# Patient Record
Sex: Male | Born: 1955 | Race: White | Hispanic: No | Marital: Married | Smoking: Former smoker
Health system: Southern US, Community
[De-identification: ages and names within clinical notes are randomized; demographics above are authoritative.]

## PROBLEM LIST (undated history)

## (undated) DIAGNOSIS — I519 Heart disease, unspecified: Secondary | ICD-10-CM

## (undated) DIAGNOSIS — Z87442 Personal history of urinary calculi: Secondary | ICD-10-CM

## (undated) DIAGNOSIS — M5126 Other intervertebral disc displacement, lumbar region: Secondary | ICD-10-CM

## (undated) DIAGNOSIS — I10 Essential (primary) hypertension: Secondary | ICD-10-CM

## (undated) DIAGNOSIS — Z86010 Personal history of colonic polyps: Secondary | ICD-10-CM

## (undated) DIAGNOSIS — Z87891 Personal history of nicotine dependence: Secondary | ICD-10-CM

## (undated) DIAGNOSIS — I447 Left bundle-branch block, unspecified: Secondary | ICD-10-CM

## (undated) HISTORY — DX: Other intervertebral disc displacement, lumbar region: M51.26

## (undated) HISTORY — DX: Essential (primary) hypertension: I10

## (undated) HISTORY — DX: Personal history of colonic polyps: Z86.010

## (undated) HISTORY — PX: COLONOSCOPY: SHX174

## (undated) HISTORY — DX: Personal history of urinary calculi: Z87.442

---

## 2004-04-17 DIAGNOSIS — Z87442 Personal history of urinary calculi: Secondary | ICD-10-CM

## 2004-04-17 HISTORY — DX: Personal history of urinary calculi: Z87.442

## 2004-10-11 ENCOUNTER — Encounter: Admission: RE | Admit: 2004-10-11 | Discharge: 2004-10-11 | Payer: Self-pay | Admitting: Family Medicine

## 2010-04-17 DIAGNOSIS — Z860100 Personal history of colon polyps, unspecified: Secondary | ICD-10-CM

## 2010-04-17 HISTORY — DX: Personal history of colonic polyps: Z86.010

## 2010-04-17 HISTORY — DX: Personal history of colon polyps, unspecified: Z86.0100

## 2010-08-16 LAB — HM COLONOSCOPY

## 2013-08-26 ENCOUNTER — Ambulatory Visit (INDEPENDENT_AMBULATORY_CARE_PROVIDER_SITE_OTHER): Payer: BC Managed Care – PPO | Admitting: Family Medicine

## 2013-08-26 ENCOUNTER — Encounter: Payer: Self-pay | Admitting: Family Medicine

## 2013-08-26 VITALS — BP 144/94 | HR 82 | Ht 70.0 in | Wt 201.0 lb

## 2013-08-26 DIAGNOSIS — IMO0002 Reserved for concepts with insufficient information to code with codable children: Secondary | ICD-10-CM

## 2013-08-26 DIAGNOSIS — M5410 Radiculopathy, site unspecified: Secondary | ICD-10-CM | POA: Insufficient documentation

## 2013-08-26 MED ORDER — PREDNISONE 50 MG PO TABS
50.0000 mg | ORAL_TABLET | Freq: Every day | ORAL | Status: DC
Start: 2013-08-26 — End: 2013-08-26

## 2013-08-26 MED ORDER — TRAMADOL HCL 50 MG PO TABS: 50.0000 mg | ORAL_TABLET | Freq: Three times a day (TID) | ORAL | Status: DC | PRN

## 2013-08-26 MED ORDER — MELOXICAM 15 MG PO TABS: 15.0000 mg | ORAL_TABLET | Freq: Every day | ORAL | Status: DC

## 2013-08-26 MED ORDER — MELOXICAM 15 MG PO TABS
15.0000 mg | ORAL_TABLET | Freq: Every day | ORAL | Status: DC
Start: 2013-08-26 — End: 2013-08-26

## 2013-08-26 MED ORDER — PREDNISONE 50 MG PO TABS: 50.0000 mg | ORAL_TABLET | Freq: Every day | ORAL | Status: DC

## 2013-08-26 NOTE — Patient Instructions (Signed)
Nice to meet you Exercises 3 times a week.  Prednisone 50mg  daily for 5 days Meloxicam daily for 10 days after prednisone Tramadol for breakthrough pain.  Exercises starting in 48 hours Ice 20 minutes 2 times a day Consider Vitamin D 2000 IU daily.  Come back in 10-14 days to make sure you are getting better.

## 2013-08-26 NOTE — Progress Notes (Signed)
  Tawana ScaleZach Smith D.O. [facility] [address] [place], [place] [address] Phone: [phone] Subjective:      CC: Back pain  OZH:YQMVHQIONGHPI:Subjective Jacob MarrowScott L Walters is a 58 y.o. male coming in with complaint of patient states that he has his pain for approximately 1 week. Patient states that it is more of a dull aching throbbing sensation is worse with standing position for a long amount of time or standing for a long amount of time. Patient has had a history of a renal stone before but states that this is significantly different. Patient denies any fevers or chills denies any pain with urination or denies any abnormal weight loss. Patient states that the severity of pain is 8/10. Patient has tried over-the-counter as well as his wife's narcotics with minimal benefit. Patient states it is affecting him with sleep as well as work as well. Patient does state that the pain does radiate down the posterior aspect of his leg all the way down to his calf. Patient denies any weakness.     Past medical history, social, surgical and family history all reviewed in electronic medical record.   Review of Systems: No headache, visual changes, nausea, vomiting, diarrhea, constipation, dizziness, abdominal pain, skin rash, fevers, chills, night sweats, weight loss, swollen lymph nodes, body aches, joint swelling, muscle aches, chest pain, shortness of breath, mood changes.   Objective Blood pressure 144/94, pulse 82, height 5\' 10"  (1.778 m), weight 201 lb (91.173 kg), SpO2 97.00%.  General: No apparent distress alert and oriented x3 mood and affect normal, dressed appropriately.  HEENT: Pupils equal, extraocular movements intact  Respiratory: Patient's speak in full sentences and does not appear short of breath  Cardiovascular: No lower extremity edema, non tender, no erythema  Skin: Warm dry intact with no signs of infection or rash on extremities or on axial skeleton.  Abdomen: Soft nontender  Neuro: Cranial  nerves II through XII are intact, neurovascularly intact in all extremities with 2+ DTRs and 2+ pulses.  Lymph: No lymphadenopathy of posterior or anterior cervical chain or axillae bilaterally.  Gait normal with good balance and coordination.  MSK:  Non tender with full range of motion and good stability and symmetric strength and tone of shoulders, elbows, wrist, hip, knee and ankles bilaterally.  Back Exam:  Inspection: Unremarkable poor core strength Motion: Flexion 35 deg, Extension 45 deg, Side Bending to 35 deg bilaterally,  Rotation to 35 deg bilaterally  SLR laying: Positive left XSLR laying: Negative  Palpable tenderness: Tender to palpation in the left paraspinal musculature on the left side FABER: negative. Sensory change: Gross sensation intact to all lumbar and sacral dermatomes.  Reflexes: 2+ at both patellar tendons, 2+ at achilles tendons, Babinski's downgoing.  Strength at foot  Plantar-flexion: 5/5 Dorsi-flexion: 5/5 Eversion: 5/5 Inversion: 5/5  Leg strength  Quad: 4/5 Hamstring: 4/5 Hip flexor: 4/5 Hip abductors: 4/5       Impression and Recommendations:     This case required medical decision making of moderate complexity.

## 2013-08-26 NOTE — Assessment & Plan Note (Addendum)
Patient is having back pain for the last week that does correspond with an S1 nerve root impingement. Patient given home exercise program, prednisone as well as anti-inflammatories and pain medications. Patient will start home exercise program in 48 hours and we discussed an icing protocol. Patient will try these interventions and come back again in 10-14 days for further evaluation. Patient continues to have trouble we'll consider further imaging such as x-ray.

## 2013-08-27 ENCOUNTER — Telehealth: Payer: Self-pay | Admitting: *Deleted

## 2013-08-27 NOTE — Telephone Encounter (Signed)
Discussed with pt

## 2013-08-27 NOTE — Telephone Encounter (Signed)
Yes ok to double up tramadol and take tylenol with tramadol.

## 2013-08-27 NOTE — Telephone Encounter (Signed)
Pt was seen yesterday, he called this morning stating that the Tramadol is not helping with his pain. Pt wants to know if he should double up on the Tramadol or if there is something else he should take.

## 2013-09-03 ENCOUNTER — Telehealth: Payer: Self-pay | Admitting: Family Medicine

## 2013-09-03 NOTE — Telephone Encounter (Signed)
Please call and ask Did the prednisone help?  If so then we can do 5 more days If it did not help he needs to be seen again so we can get xrays and likely MRI.  Thank you

## 2013-09-03 NOTE — Telephone Encounter (Signed)
Discussed with pt. He states the prednisone did not help. Pt is coming for appt tomorrow.

## 2013-09-03 NOTE — Telephone Encounter (Signed)
Pt's spouse call stated that the pain pill that Mr. Jacob Walters is on is not working. Please call pt back, he cant even move/work.

## 2013-09-04 ENCOUNTER — Other Ambulatory Visit: Payer: Self-pay | Admitting: *Deleted

## 2013-09-04 ENCOUNTER — Ambulatory Visit (INDEPENDENT_AMBULATORY_CARE_PROVIDER_SITE_OTHER): Payer: BC Managed Care – PPO | Admitting: Family Medicine

## 2013-09-04 ENCOUNTER — Ambulatory Visit (INDEPENDENT_AMBULATORY_CARE_PROVIDER_SITE_OTHER)
Admission: RE | Admit: 2013-09-04 | Discharge: 2013-09-04 | Disposition: A | Discharge status: Home / Self Care | Payer: BC Managed Care – PPO | Source: Ambulatory Visit | Attending: Family Medicine | Admitting: Family Medicine

## 2013-09-04 ENCOUNTER — Encounter: Payer: Self-pay | Admitting: Family Medicine

## 2013-09-04 VITALS — BP 140/86 | HR 104 | Wt 195.0 lb

## 2013-09-04 DIAGNOSIS — IMO0002 Reserved for concepts with insufficient information to code with codable children: Secondary | ICD-10-CM

## 2013-09-04 DIAGNOSIS — M5410 Radiculopathy, site unspecified: Secondary | ICD-10-CM

## 2013-09-04 MED ORDER — DIAZEPAM 5 MG PO TABS: 5.0000 mg | ORAL_TABLET | Freq: Two times a day (BID) | ORAL | Status: DC | PRN

## 2013-09-04 MED ORDER — GABAPENTIN 300 MG PO CAPS: 300.0000 mg | ORAL_CAPSULE | Freq: Every day | ORAL | Status: DC

## 2013-09-04 NOTE — Patient Instructions (Addendum)
Sent in new medicine at night Valium take 30 minutes before MRI if we do this I will call you with the results of the xray soon.  Continue other meds for now

## 2013-09-04 NOTE — Assessment & Plan Note (Signed)
Patient continues to have severe back pain with radicular symptoms. Patient does have some mild weakness down the left leg as well compared to his previous visit. I discussed with patient though without further imaging is necessary. We'll get x-rays at this time and the patient continues to not respond to conservative therapy which is the addition of gabapentin at night we need to consider doing an MRI. I think there is a likelihood that patient does have an S1 nerve root compression. We'll discuss after imaging for further evaluation and treatment.  Spent greater than 25 minutes with patient face-to-face and had greater than 50% of counseling including as described above in assessment and plan.

## 2013-09-04 NOTE — Progress Notes (Signed)
  Tawana ScaleZach Verley Pariseau D.O. [facility] [address] [place], [place] [address] Phone: [phone] Subjective:      CC: Back pain follow up  YQM:VHQIONGEXBHPI:Subjective Jacob Walters is a 58 y.o. male coming in with complaint of back pain. Patient was seen previously and there was a concern for patient having radiculopathy. Patient was tried to be treated conservatively with prednisone, anti-inflammatories, and home exercise program. Patient has not made any significant improvement whatsoever. Patient states he did not make any improvement with the prednisone either. Patient states that the pain is unrelenting and continuing to keep him up at night. Patient has been doubling up on the tramadol to try to get some relief. Patient states that the pain is so severe he continues to wake him up at night. Patient denies any weakness but states at the radiation now goes all the way to his calf and near his ankle the     Past medical history, social, surgical and family history all reviewed in electronic medical record.   Review of Systems: No headache, visual changes, nausea, vomiting, diarrhea, constipation, dizziness, abdominal pain, skin rash, fevers, chills, night sweats, weight loss, swollen lymph nodes, body aches, joint swelling, muscle aches, chest pain, shortness of breath, mood changes.   Objective Blood pressure 140/86, pulse 104, weight 195 lb (88.451 kg), SpO2 97.00%.  General: No apparent distress alert and oriented x3 mood and affect normal, dressed appropriately.  HEENT: Pupils equal, extraocular movements intact  Respiratory: Patient's speak in full sentences and does not appear short of breath  Cardiovascular: No lower extremity edema, non tender, no erythema  Skin: Warm dry intact with no signs of infection or rash on extremities or on axial skeleton.  Abdomen: Soft nontender  Neuro: Cranial nerves II through XII are intact, neurovascularly intact in all extremities with 2+ DTRs and 2+  pulses.  Lymph: No lymphadenopathy of posterior or anterior cervical chain or axillae bilaterally.  Gait normal with good balance and coordination.  MSK:  Non tender with full range of motion and good stability and symmetric strength and tone of shoulders, elbows, wrist, hip, knee and ankles bilaterally.  Back Exam:  Inspection: Unremarkable poor core strength Motion: Flexion 35 deg, Extension 45 deg, Side Bending to 35 deg bilaterally,  Rotation to 35 deg bilaterally  SLR laying: Positive left XSLR laying: Positive with radicular symptoms down the left leg Palpable tenderness: Tender to palpation in the left paraspinal musculature on the left side FABER: negative. Sensory change: Gross sensation intact to all lumbar and sacral dermatomes.  Reflexes: 2+ at both patellar tendons, 2+ at achilles tendons, Babinski's downgoing.  Strength at foot  Plantar-flexion: 5/5 Dorsi-flexion: 5/5 Eversion: 5/5 Inversion: 5/5  Leg strength  Quad: 4/5 Hamstring: 4/5 Hip flexor: 4/5 Hip abductors: 4/5       Impression and Recommendations:     This case required medical decision making of moderate complexity.

## 2013-09-05 ENCOUNTER — Telehealth: Payer: Self-pay | Admitting: Family Medicine

## 2013-09-05 NOTE — Telephone Encounter (Signed)
Pt called stated that he is trying to get refill for tramadol and they told him he can no get it refill until 09/09/13. Pt stated he told Dr. Katrinka Blazing that he has to double up the dosage because it didn't work too good for him. Please call pt, pt is out of this med.

## 2013-09-05 NOTE — Telephone Encounter (Signed)
Spoke with pharmacy & it's been taken care of. Pt made aware.

## 2013-09-05 NOTE — Telephone Encounter (Signed)
refilled 

## 2013-09-11 ENCOUNTER — Telehealth: Payer: Self-pay | Admitting: Family Medicine

## 2013-09-11 MED ORDER — HYDROCODONE-ACETAMINOPHEN 7.5-325 MG PO TABS: 1.0000 | ORAL_TABLET | Freq: Three times a day (TID) | ORAL | Status: DC | PRN

## 2013-09-11 NOTE — Telephone Encounter (Signed)
Discussed with patient over the phone. Patient is taking tramadol with Tylenol 4 times a day as well the meloxicam. Patient is taking gabapentin one time before bed and one time in the middle of the night at 300 mg at each dose. Patient is still having significant amount of pain that is coming in to did not want to move at all. Patient states that the pain seems to be worsening and not getting better. I do feel that possibly some narcotics for short period to be beneficial. Patient will come over and pick up medication. Patient scheduled for the MRI on Sunday.

## 2013-09-11 NOTE — Telephone Encounter (Signed)
Patient's wife called in regards to getting a stronger pain medication prescribed for the patient's back pain. She says the Tramadol is not working. She wants [place] to call the patient to advise what else Dingledine be recommended. Please advise.

## 2013-09-14 ENCOUNTER — Ambulatory Visit
Admission: RE | Admit: 2013-09-14 | Discharge: 2013-09-14 | Disposition: A | Discharge status: Home / Self Care | Payer: BC Managed Care – PPO | Source: Ambulatory Visit | Attending: Family Medicine | Admitting: Family Medicine

## 2013-09-14 DIAGNOSIS — M5410 Radiculopathy, site unspecified: Secondary | ICD-10-CM

## 2013-09-16 ENCOUNTER — Encounter: Payer: Self-pay | Admitting: Family Medicine

## 2013-09-16 ENCOUNTER — Ambulatory Visit (INDEPENDENT_AMBULATORY_CARE_PROVIDER_SITE_OTHER): Payer: BC Managed Care – PPO | Admitting: Family Medicine

## 2013-09-16 VITALS — BP 144/94 | HR 109 | Ht 70.0 in | Wt 190.0 lb

## 2013-09-16 DIAGNOSIS — M5410 Radiculopathy, site unspecified: Secondary | ICD-10-CM

## 2013-09-16 DIAGNOSIS — IMO0002 Reserved for concepts with insufficient information to code with codable children: Secondary | ICD-10-CM

## 2013-09-16 MED ORDER — HYDROCODONE-ACETAMINOPHEN 7.5-325 MG PO TABS: 1.0000 | ORAL_TABLET | Freq: Three times a day (TID) | ORAL | Status: DC | PRN

## 2013-09-16 NOTE — Patient Instructions (Signed)
It is good to see you Jacob Walters refill the norco one more time.  Note for work .  Epidural steroid injections  Come back in 2 weeks after the injection and we Jacob Walters check in.

## 2013-09-16 NOTE — Progress Notes (Signed)
  [facility] [address] [place], [place] [address] Phone: [phone] Subjective:      CC: Back pain follow up  YIF:OYDXAJOINO Jacob Walters is a 58 y.o. male coming in with complaint of back pain. Patient continues to have back pain. Patient is nonnarcotics just for pain relief. Patient was sent for an MRI that does show an S1 nerve root impingement on the left side. Patient states that the pain has been significant and has not been able to work on a regular basis. Patient states that the pain is unrelenting and would like something further done.  MRI was reviewed today and findings were described above.    Past medical history, social, surgical and family history all reviewed in electronic medical record.   Review of Systems: No headache, visual changes, nausea, vomiting, diarrhea, constipation, dizziness, abdominal pain, skin rash, fevers, chills, night sweats, weight loss, swollen lymph nodes, body aches, joint swelling, muscle aches, chest pain, shortness of breath, mood changes.   Objective Blood pressure 144/94, pulse 109, height 5\' 10"  (1.778 m), weight 190 lb (86.183 kg), SpO2 97.00%.  General: No apparent distress alert and oriented x3 mood and affect normal, dressed appropriately.  HEENT: Pupils equal, extraocular movements intact  Respiratory: Patient's speak in full sentences and does not appear short of breath  Cardiovascular: No lower extremity edema, non tender, no erythema  Skin: Warm dry intact with no signs of infection or rash on extremities or on axial skeleton.  Abdomen: Soft nontender  Neuro: Cranial nerves II through XII are intact, neurovascularly intact in all extremities with 2+ DTRs and 2+ pulses.  Lymph: No lymphadenopathy of posterior or anterior cervical chain or axillae bilaterally.  Gait normal with good balance and coordination.  MSK:  Non tender with full range of motion and good stability and symmetric strength and tone  of shoulders, elbows, wrist, hip, knee and ankles bilaterally.  Back Exam:  Inspection: Unremarkable poor core strength Motion: Flexion 35 deg, Extension 45 deg, Side Bending to 35 deg bilaterally,  Rotation to 35 deg bilaterally  SLR laying: Positive left XSLR laying: Positive with radicular symptoms down the left leg Palpable tenderness: Tender to palpation in the left paraspinal musculature on the left side, no change from previous exam. FABER: negative. Sensory change: Gross sensation intact to all lumbar and sacral dermatomes.  Reflexes: 2+ at both patellar tendons, 2+ at achilles tendons, Babinski's downgoing.  Strength at foot  Plantar-flexion: 5/5 Dorsi-flexion: 5/5 Eversion: 5/5 Inversion: 5/5  Leg strength  Quad: 4/5 Hamstring: 4/5 Hip flexor: 4/5 Hip abductors: 4/5       Impression and Recommendations:     This case required medical decision making of moderate complexity.

## 2013-09-16 NOTE — Assessment & Plan Note (Signed)
We discussed multiple different treatment options at this time. Patient has elected to try an epidural steroid injection. Patient will be made a referral and make any urgent status. We'll have patient have an L5-S1 epidural. Patient and will come back 2 weeks after the epidural and we'll discuss this patient had therapeutic improvement. Epidural steroid injection we'll also be diagnostic. Patient continues that problem he Milazzo need surgical intervention.  Spent greater than 25 minutes with patient face-to-face and had greater than 50% of counseling including as described above in assessment and plan.

## 2013-09-17 ENCOUNTER — Other Ambulatory Visit: Payer: Self-pay | Admitting: *Deleted

## 2013-09-17 DIAGNOSIS — M5410 Radiculopathy, site unspecified: Secondary | ICD-10-CM

## 2013-09-18 ENCOUNTER — Ambulatory Visit
Admission: RE | Admit: 2013-09-18 | Discharge: 2013-09-18 | Disposition: A | Discharge status: Home / Self Care | Payer: BC Managed Care – PPO | Source: Ambulatory Visit | Attending: Family Medicine | Admitting: Family Medicine

## 2013-09-18 VITALS — BP 199/105 | HR 114

## 2013-09-18 DIAGNOSIS — M5410 Radiculopathy, site unspecified: Secondary | ICD-10-CM

## 2013-09-18 MED ORDER — IOHEXOL 180 MG/ML  SOLN
1.0000 mL | Freq: Once | INTRAMUSCULAR | Status: AC | PRN
  Administered 2013-09-18: 1 mL via EPIDURAL

## 2013-09-18 MED ORDER — METHYLPREDNISOLONE ACETATE 40 MG/ML INJ SUSP (RADIOLOG
120.0000 mg | Freq: Once | INTRAMUSCULAR | Status: AC
  Administered 2013-09-18: 120 mg via EPIDURAL

## 2013-09-18 NOTE — Discharge Instructions (Signed)
Post Procedure Spinal Discharge Instruction Sheet ° °1. You Rance resume a regular diet and any medications that you routinely take (including pain medications). ° °2. No driving day of procedure. ° °3. Light activity throughout the rest of the day.  Do not do any strenuous work, exercise, bending or lifting.  The day following the procedure, you can resume normal physical activity but you should refrain from exercising or physical therapy for at least three days thereafter. ° ° °Common Side Effects: ° °· Headaches- take your usual medications as directed by your physician.  Increase your fluid intake.  Caffeinated beverages Gaiser be helpful.  Lie flat in bed until your headache resolves. ° °· Restlessness or inability to sleep- you Rudy have trouble sleeping for the next few days.  Ask your referring physician if you need any medication for sleep. ° °· Facial flushing or redness- should subside within a few days. ° °· Increased pain- a temporary increase in pain a day or two following your procedure is not unusual.  Take your pain medication as prescribed by your referring physician. ° °· Leg cramps ° °Please contact our office at [phone] for the following symptoms: °· Fever greater than 100 degrees. °· Headaches unresolved with medication after 2-3 days. °· Increased swelling, pain, or redness at injection site. ° °Thank you for visiting our office. °

## 2013-09-29 ENCOUNTER — Ambulatory Visit (INDEPENDENT_AMBULATORY_CARE_PROVIDER_SITE_OTHER): Payer: BC Managed Care – PPO | Admitting: Family Medicine

## 2013-09-29 ENCOUNTER — Encounter: Payer: Self-pay | Admitting: Family Medicine

## 2013-09-29 VITALS — BP 138/84 | HR 122 | Ht 70.0 in | Wt 185.0 lb

## 2013-09-29 DIAGNOSIS — M5410 Radiculopathy, site unspecified: Secondary | ICD-10-CM

## 2013-09-29 DIAGNOSIS — IMO0002 Reserved for concepts with insufficient information to code with codable children: Secondary | ICD-10-CM

## 2013-09-29 MED ORDER — HYDROCODONE-ACETAMINOPHEN 7.5-325 MG PO TABS: 1.0000 | ORAL_TABLET | Freq: Three times a day (TID) | ORAL | Status: DC | PRN

## 2013-09-29 NOTE — Progress Notes (Signed)
  Tawana ScaleZach Smith D.O. [facility] [address] [place], [place] [address] Phone: [phone](336) [phone] Subjective:      CC: Back pain follow up  ZHY:QMVHQIONGEHPI:Subjective Jacob Walters is a 58 y.o. male coming in with complaint of back pain.  Patient was sent for an MRI that does show an S1 nerve root impingement on the left side. Patient was sent for an epidural which patient did have. Patient states that he did have some resolution of pain. Patient states that this pain was resolved for approximately hours after the injection. Patient states that unfortunately came back. Patient states now it is just as bad as the was prior to the epidural steroid injection one week ago. Denies any new symptoms.  Patient has failed conservative therapy including oral anti-inflammatories, corticosteroids, home as well as formal physical therapy, as well as epidural steroid injection. Patient does have symptoms that correlate well with S1 nerve root impingement on the left side an MRI corresponding well.    Past medical history, social, surgical and family history all reviewed in electronic medical record.   Review of Systems: No headache, visual changes, nausea, vomiting, diarrhea, constipation, dizziness, abdominal pain, skin rash, fevers, chills, night sweats, weight loss, swollen lymph nodes, body aches, joint swelling, muscle aches, chest pain, shortness of breath, mood changes.   Objective Blood pressure 138/84, pulse 122, height 5\' 10"  (1.778 m), weight 185 lb (83.915 kg), SpO2 98.00%.  General: No apparent distress alert and oriented x3 mood and affect normal, dressed appropriately.  HEENT: Pupils equal, extraocular movements intact  Respiratory: Patient's speak in full sentences and does not appear short of breath  Cardiovascular: No lower extremity edema, non tender, no erythema  Skin: Warm dry intact with no signs of infection or rash on extremities or on axial skeleton.  Abdomen: Soft nontender  Neuro:  Cranial nerves II through XII are intact, neurovascularly intact in all extremities with 2+ DTRs and 2+ pulses.  Lymph: No lymphadenopathy of posterior or anterior cervical chain or axillae bilaterally.  Gait normal with good balance and coordination.  MSK:  Non tender with full range of motion and good stability and symmetric strength and tone of shoulders, elbows, wrist, hip, knee and ankles bilaterally.  Back Exam:  Inspection: Unremarkable poor core strength Motion: Flexion 35 deg, Extension 45 deg, Side Bending to 35 deg bilaterally,  Rotation to 35 deg bilaterally  SLR laying: Positive left XSLR laying: Positive with radicular symptoms down the left leg Palpable tenderness: Tender to palpation in the left paraspinal musculature on the left side, no change from previous exam. FABER: negative. Sensory change: Gross sensation intact to all lumbar and sacral dermatomes.  Reflexes: 2+ at both patellar tendons, 2+ at achilles tendons, Babinski's downgoing.  Strength at foot  Plantar-flexion: 5/5 Dorsi-flexion: 5/5 Eversion: 5/5 Inversion: 5/5  Leg strength  Quad: 4/5 Hamstring: 4/5 Hip flexor: 4/5 Hip abductors: 4/5   No significant change from previous exam.   Impression and Recommendations:     This case required medical decision making of moderate complexity.

## 2013-09-29 NOTE — Assessment & Plan Note (Signed)
Once again patient has chronic intractable pain that is not responding to  conservative measures. Patient did have some response to the L5-S1 epidural injection which helps us better diagnose patient's nerve root impingement. 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 Patient is unable to work secondary to this pain. We like him to have an evaluation by orthopedic surgery. There is likelihood patient will need a microdiscectomy with him failing all conservative measures. Referral has been made Dr. Yevette Edwardsumonski. Patient can discuss any other questions with me if he has any during this process.  Spent greater than 25 minutes with patient face-to-face and had greater than 50% of counseling including as described above in assessment and plan.

## 2013-09-29 NOTE — Patient Instructions (Signed)
Good to see you Dr[facility] Estill BambergMark Dumonski [facility] [address] [facility] [phone] Tell them xrays and MRI Friday 845am

## 2013-10-14 ENCOUNTER — Other Ambulatory Visit: Payer: Self-pay | Admitting: Orthopedic Surgery

## 2013-10-14 ENCOUNTER — Encounter (HOSPITAL_COMMUNITY): Payer: Self-pay | Admitting: Pharmacy Technician

## 2013-10-15 DIAGNOSIS — M5126 Other intervertebral disc displacement, lumbar region: Secondary | ICD-10-CM

## 2013-10-15 HISTORY — DX: Other intervertebral disc displacement, lumbar region: M51.26

## 2013-10-20 ENCOUNTER — Encounter (HOSPITAL_COMMUNITY)
Admission: RE | Admit: 2013-10-20 | Discharge: 2013-10-20 | Disposition: A | Discharge status: Home / Self Care | Payer: BC Managed Care – PPO | Source: Ambulatory Visit | Attending: Orthopedic Surgery | Admitting: Orthopedic Surgery

## 2013-10-20 ENCOUNTER — Ambulatory Visit (HOSPITAL_COMMUNITY)
Admission: RE | Admit: 2013-10-20 | Discharge: 2013-10-20 | Disposition: A | Discharge status: Home / Self Care | Payer: BC Managed Care – PPO | Source: Ambulatory Visit | Attending: Orthopedic Surgery | Admitting: Orthopedic Surgery

## 2013-10-20 ENCOUNTER — Encounter (HOSPITAL_COMMUNITY): Payer: Self-pay

## 2013-10-20 ENCOUNTER — Emergency Department (HOSPITAL_COMMUNITY): Payer: BC Managed Care – PPO

## 2013-10-20 ENCOUNTER — Observation Stay (HOSPITAL_COMMUNITY)
Admission: EM | Admit: 2013-10-20 | Discharge: 2013-10-21 | Disposition: A | Discharge status: Home / Self Care | Payer: BC Managed Care – PPO | Attending: Cardiovascular Disease | Admitting: Cardiovascular Disease

## 2013-10-20 ENCOUNTER — Encounter (HOSPITAL_COMMUNITY): Payer: Self-pay | Admitting: Emergency Medicine

## 2013-10-20 ENCOUNTER — Other Ambulatory Visit: Payer: Self-pay

## 2013-10-20 DIAGNOSIS — R000 Tachycardia, unspecified: Principal | ICD-10-CM | POA: Insufficient documentation

## 2013-10-20 DIAGNOSIS — N189 Chronic kidney disease, unspecified: Secondary | ICD-10-CM | POA: Insufficient documentation

## 2013-10-20 DIAGNOSIS — I10 Essential (primary) hypertension: Secondary | ICD-10-CM | POA: Diagnosis present

## 2013-10-20 DIAGNOSIS — I519 Heart disease, unspecified: Secondary | ICD-10-CM | POA: Diagnosis present

## 2013-10-20 DIAGNOSIS — Z860100 Personal history of colon polyps, unspecified: Secondary | ICD-10-CM | POA: Insufficient documentation

## 2013-10-20 DIAGNOSIS — R011 Cardiac murmur, unspecified: Secondary | ICD-10-CM | POA: Insufficient documentation

## 2013-10-20 DIAGNOSIS — I129 Hypertensive chronic kidney disease with stage 1 through stage 4 chronic kidney disease, or unspecified chronic kidney disease: Secondary | ICD-10-CM | POA: Insufficient documentation

## 2013-10-20 DIAGNOSIS — M5410 Radiculopathy, site unspecified: Secondary | ICD-10-CM | POA: Diagnosis present

## 2013-10-20 DIAGNOSIS — M5430 Sciatica, unspecified side: Secondary | ICD-10-CM | POA: Insufficient documentation

## 2013-10-20 DIAGNOSIS — I4510 Unspecified right bundle-branch block: Secondary | ICD-10-CM | POA: Insufficient documentation

## 2013-10-20 DIAGNOSIS — Z87891 Personal history of nicotine dependence: Secondary | ICD-10-CM | POA: Insufficient documentation

## 2013-10-20 DIAGNOSIS — E876 Hypokalemia: Secondary | ICD-10-CM

## 2013-10-20 DIAGNOSIS — Z8739 Personal history of other diseases of the musculoskeletal system and connective tissue: Secondary | ICD-10-CM | POA: Insufficient documentation

## 2013-10-20 DIAGNOSIS — Z87442 Personal history of urinary calculi: Secondary | ICD-10-CM | POA: Insufficient documentation

## 2013-10-20 DIAGNOSIS — I447 Left bundle-branch block, unspecified: Secondary | ICD-10-CM

## 2013-10-20 HISTORY — DX: Personal history of nicotine dependence: Z87.891

## 2013-10-20 HISTORY — DX: Left bundle-branch block, unspecified: I44.7

## 2013-10-20 HISTORY — DX: Heart disease, unspecified: I51.9

## 2013-10-20 LAB — CBC
HCT: 42.7 % (ref 39.0–52.0)
Hemoglobin: 14.5 g/dL (ref 13.0–17.0)
MCH: 29.1 pg (ref 26.0–34.0)
MCHC: 34 g/dL (ref 30.0–36.0)
MCV: 85.6 fL (ref 78.0–100.0)
Platelets: 199 10*3/uL (ref 150–400)
RBC: 4.99 MIL/uL (ref 4.22–5.81)
RDW: 13.6 % (ref 11.5–15.5)
WBC: 8.6 10*3/uL (ref 4.0–10.5)

## 2013-10-20 LAB — COMPREHENSIVE METABOLIC PANEL
ALBUMIN: 3.9 g/dL (ref 3.5–5.2)
ALK PHOS: 88 U/L (ref 39–117)
ALT: 30 U/L (ref 0–53)
ALT: 28 U/L (ref 0–53)
ANION GAP: 16 — AB (ref 5–15)
AST: 17 U/L (ref 0–37)
AST: 15 U/L (ref 0–37)
Albumin: 4.2 g/dL (ref 3.5–5.2)
Alkaline Phosphatase: 82 U/L (ref 39–117)
Anion gap: 17 — ABNORMAL HIGH (ref 5–15)
BILIRUBIN TOTAL: 0.2 mg/dL — AB (ref 0.3–1.2)
BUN: 20 mg/dL (ref 6–23)
BUN: 18 mg/dL (ref 6–23)
CO2: 21 mEq/L (ref 19–32)
CO2: 22 mEq/L (ref 19–32)
Calcium: 9.2 mg/dL (ref 8.4–10.5)
Calcium: 9 mg/dL (ref 8.4–10.5)
Chloride: 105 mEq/L (ref 96–112)
Chloride: 104 mEq/L (ref 96–112)
Creatinine, Ser: 0.87 mg/dL (ref 0.50–1.35)
Creatinine, Ser: 0.78 mg/dL (ref 0.50–1.35)
GFR calc non Af Amer: >90 mL/min (ref >90)
GFR calc non Af Amer: >90 mL/min (ref >90)
Glucose, Bld: 111 mg/dL — ABNORMAL HIGH (ref 70–99)
Glucose, Bld: 110 mg/dL — ABNORMAL HIGH (ref 70–99)
POTASSIUM: 3.8 meq/L (ref 3.7–5.3)
Potassium: 3.4 mEq/L — ABNORMAL LOW (ref 3.7–5.3)
SODIUM: 143 meq/L (ref 137–147)
Sodium: 142 mEq/L (ref 137–147)
Total Bilirubin: 0.3 mg/dL (ref 0.3–1.2)
Total Protein: 7.4 g/dL (ref 6.0–8.3)
Total Protein: 7 g/dL (ref 6.0–8.3)

## 2013-10-20 LAB — CBC WITH DIFFERENTIAL/PLATELET
Basophils Absolute: 0 10*3/uL (ref 0.0–0.1)
Basophils Relative: 0 % (ref 0–1)
EOS ABS: 0.2 10*3/uL (ref 0.0–0.7)
Eosinophils Relative: 2 % (ref 0–5)
HCT: 45.6 % (ref 39.0–52.0)
Hemoglobin: 15.5 g/dL (ref 13.0–17.0)
LYMPHS ABS: 2.4 10*3/uL (ref 0.7–4.0)
Lymphocytes Relative: 22 % (ref 12–46)
MCH: 29.4 pg (ref 26.0–34.0)
MCHC: 34 g/dL (ref 30.0–36.0)
MCV: 86.4 fL (ref 78.0–100.0)
Monocytes Absolute: 0.5 10*3/uL (ref 0.1–1.0)
Monocytes Relative: 5 % (ref 3–12)
NEUTROS PCT: 71 % (ref 43–77)
Neutro Abs: 7.8 10*3/uL — ABNORMAL HIGH (ref 1.7–7.7)
PLATELETS: 236 10*3/uL (ref 150–400)
RBC: 5.28 MIL/uL (ref 4.22–5.81)
RDW: 13.6 % (ref 11.5–15.5)
WBC: 11 10*3/uL — ABNORMAL HIGH (ref 4.0–10.5)

## 2013-10-20 LAB — URINALYSIS, ROUTINE W REFLEX MICROSCOPIC
Bilirubin Urine: NEGATIVE
GLUCOSE, UA: NEGATIVE mg/dL
Hgb urine dipstick: NEGATIVE
Ketones, ur: NEGATIVE mg/dL
Leukocytes, UA: NEGATIVE
NITRITE: NEGATIVE
Protein, ur: NEGATIVE mg/dL
Specific Gravity, Urine: 1.041 — ABNORMAL HIGH (ref 1.005–1.030)
Urobilinogen, UA: 0.2 mg/dL (ref 0.0–1.0)
pH: 5 (ref 5.0–8.0)

## 2013-10-20 LAB — I-STAT TROPONIN, ED: TROPONIN I, POC: 0.01 ng/mL (ref 0.00–0.08)

## 2013-10-20 LAB — TSH: TSH: 1.57 u[IU]/mL (ref 0.350–4.500)

## 2013-10-20 LAB — PROTIME-INR
INR: 0.98 (ref 0.00–1.49)
Prothrombin Time: 13 seconds (ref 11.6–15.2)

## 2013-10-20 LAB — T4, FREE: Free T4: 1.44 ng/dL (ref 0.80–1.80)

## 2013-10-20 LAB — TYPE AND SCREEN
ABO/RH(D): A POS
Antibody Screen: NEGATIVE

## 2013-10-20 LAB — APTT: APTT: 28 s (ref 24–37)

## 2013-10-20 LAB — ABO/RH: ABO/RH(D): A POS

## 2013-10-20 LAB — D-DIMER, QUANTITATIVE (NOT AT ARMC): D-Dimer, Quant: <0.27 ug/mL-FEU (ref 0.00–0.48)

## 2013-10-20 LAB — SURGICAL PCR SCREEN
MRSA, PCR: NEGATIVE
Staphylococcus aureus: NEGATIVE

## 2013-10-20 MED ORDER — ATENOLOL 25 MG PO TABS
25.0000 mg | ORAL_TABLET | Freq: Every day | ORAL | Status: DC
  Administered 2013-10-20 – 2013-10-21 (×2): 25 mg via ORAL
  Filled 2013-10-20 (×2): qty 1

## 2013-10-20 MED ORDER — OXYCODONE-ACETAMINOPHEN 5-325 MG PO TABS
1.0000 | ORAL_TABLET | ORAL | Status: DC | PRN
  Administered 2013-10-21: 1 via ORAL
  Filled 2013-10-20: qty 1

## 2013-10-20 MED ORDER — POTASSIUM CHLORIDE CRYS ER 20 MEQ PO TBCR
40.0000 meq | EXTENDED_RELEASE_TABLET | Freq: Once | ORAL | Status: AC
  Administered 2013-10-20: 40 meq via ORAL
  Filled 2013-10-20: qty 2

## 2013-10-20 MED ORDER — HYDROMORPHONE HCL PF 1 MG/ML IJ SOLN
1.0000 mg | INTRAMUSCULAR | Status: AC | PRN
  Administered 2013-10-20 – 2013-10-21 (×3): 1 mg via INTRAVENOUS
  Filled 2013-10-20 (×3): qty 1

## 2013-10-20 MED ORDER — SODIUM CHLORIDE 0.9 % IV SOLN
1000.0000 mL | INTRAVENOUS | Status: DC
  Administered 2013-10-20: 1000 mL via INTRAVENOUS

## 2013-10-20 MED ORDER — ENOXAPARIN SODIUM 40 MG/0.4ML ~~LOC~~ SOLN
40.0000 mg | SUBCUTANEOUS | Status: DC
  Filled 2013-10-20: qty 0.4

## 2013-10-20 NOTE — ED Notes (Signed)
EKG handed to Dr. Knapp. 

## 2013-10-20 NOTE — ED Notes (Signed)
Pt was at pre-op check up today for back surgery on this coming Wednesday. Pt has been off of his atenolol for approx. 3 months. Pt was found to have HR 148 today and BP 165/95. Pt alert and oriented. Pt in a lot of back pain.

## 2013-10-20 NOTE — Progress Notes (Addendum)
Pt. Was previously being seen by Dr. Vear ClockPhillips in [place] for [place]CP, was taking Atenolol for 3-4 yrs. But recently has not been seen or gotten refill on Atenolol.  Pt. Has made contact with [facility]iel SitesZ. Smith at [facility] for [facility] but hasn't been [facility] by [facility] yet.  His appt. For Dr. Felicity CoyerLeschber is in October 2015. Pt. Pulse 148, 120, 124bpm. Pt. Denies any distress or changes  in his chest, or with his breathing.  Report of the same given to A. Zelenak,PA-C

## 2013-10-20 NOTE — ED Notes (Signed)
Attempted to call report

## 2013-10-20 NOTE — ED Notes (Signed)
Cardiologist in to see patient.

## 2013-10-20 NOTE — Progress Notes (Signed)
10/20/13 1544  OBSTRUCTIVE SLEEP APNEA  Have you ever been diagnosed with sleep apnea through a sleep study? No  Do you snore loudly (loud enough to be heard through closed doors)?  1  Do you often feel tired, fatigued, or sleepy during the daytime? 1  Has anyone observed you stop breathing during your sleep? 0  Do you have, or are you being treated for high blood pressure? 1  BMI more than 35 kg/m2? 0  Age over 57 years old? 1  Neck circumference greater than 40 cm/16 inches? 0  Gender: 1  Obstructive Sleep Apnea Score 5  Score 4 or greater  Results sent to PCP

## 2013-10-20 NOTE — ED Notes (Signed)
Patient states the MD is waiting to hear back from the cardiologist

## 2013-10-20 NOTE — ED Provider Notes (Signed)
CSN: [id number]     Arrival date & time 10/20/13  1726 History   First MD Initiated Contact with Patient 10/20/13 1727     Chief Complaint  Patient presents with  . Tachycardia    HPI Pt was getting routine tests pre-operatively in anticipation of back surgery.  While there they noticed his heart was racing so he was sent to the Emergency department.  Pt has history of irregular heart beat years ago but they felt it was related to alcohol use.  He has not been drinking for a year and half.  Pt denies trouble with chest pain or his breathing.  He is having a lot of pain in his back.  The pain goes down the left leg.  The pain is severe.  He is scheduled for surgery on Wednesday.  He has been taking his medications but they have not been helping.  Pt is very concerned that his surgery will be delayed.  Past Medical History  Diagnosis Date  . Heart murmur   . History of kidney stones   . Hx of colonic polyps   . Hypertension     was taking atenolol for several yrs., for BP &  arrymthmia - but has been off for 4 months.   . Chronic kidney disease     passed  spontaneously  . Arthritis     herniated disc- per pt.    Past Surgical History  Procedure Laterality Date  . Colonoscopy     Family History  Problem Relation Age of Onset  . Breast cancer Maternal Grandmother   . Alcohol abuse Other   . Diabetes Other    History  Substance Use Topics  . Smoking status: Former Smoker -- 25 years    Quit date: 10/20/2012  . Smokeless tobacco: Not on file  . Alcohol Use: Yes     Comment: quit 07/2012- had been drinking 6-8 beers /night    Review of Systems  Respiratory: Negative for shortness of breath.   Cardiovascular: Negative for chest pain.  Musculoskeletal: Negative for joint swelling.  All other systems reviewed and are negative.     Allergies  Review of patient's allergies indicates no known allergies.  Home Medications   Prior to Admission medications   Medication Sig  Start Date End Date Taking? Authorizing Provider  acetaminophen (TYLENOL) 500 MG tablet Take 1,000 mg by mouth every 6 (six) hours as needed.   Yes Historical Provider, MD  cyclobenzaprine (FLEXERIL) 10 MG tablet Take 10 mg by mouth 3 (three) times daily as needed for muscle spasms.   Yes Historical Provider, MD  oxyCODONE-acetaminophen (PERCOCET/ROXICET) 5-325 MG per tablet Take 1 tablet by mouth every 4 (four) hours as needed for severe pain.   Yes Historical Provider, MD   BP 161/94  Pulse 92  Temp(Src) 97.7 F (36.5 C) (Oral)  Resp 16  Ht 5\' 10"  (1.778 m)  Wt 180 lb (81.647 kg)  BMI 25.83 kg/m2  SpO2 100% Physical Exam  Nursing note and vitals reviewed. Constitutional: No distress.  HENT:  Head: Normocephalic and atraumatic.  Right Ear: External ear normal.  Left Ear: External ear normal.  Eyes: Conjunctivae are normal. Right eye exhibits no discharge. Left eye exhibits no discharge. No scleral icterus.  Neck: Neck supple. No tracheal deviation present.  Cardiovascular: Normal rate, regular rhythm and intact distal pulses.   Pulmonary/Chest: Effort normal and breath sounds normal. No stridor. No respiratory distress. He has no wheezes. He has no rales.  Abdominal: Soft. Bowel sounds are normal. He exhibits no distension. There is no tenderness. There is no rebound and no guarding.  Musculoskeletal: He exhibits no edema.       Lumbar back: He exhibits decreased range of motion and tenderness. He exhibits no swelling and no edema.  No calf ttp, no lower extremity edema,  Neurological: He is alert. He has normal strength. No cranial nerve deficit (no facial droop, extraocular movements intact, no slurred speech) or sensory deficit. He exhibits normal muscle tone. He displays no seizure activity. Coordination normal.  5/5 plantar and dorsiflexion bilateral lower extrem, sensation to light touch intact  Skin: Skin is warm and dry. No rash noted.  Psychiatric: He has a normal mood and  affect.    ED Course  Procedures (including critical care time) Labs Review Labs Reviewed  COMPREHENSIVE METABOLIC PANEL - Abnormal; Notable for the following:    Potassium 3.4 (*)    Glucose, Bld 110 (*)    Total Bilirubin 0.2 (*)    Anion gap 16 (*)    All other components within normal limits  CBC  D-DIMER, QUANTITATIVE  TSH  T4, FREE  I-STAT TROPOININ, ED    Imaging Review Dg Chest 2 View  10/20/2013   CLINICAL DATA:  Preoperative evaluation for spine surgery  EXAM: CHEST  2 VIEW  COMPARISON:  None.  FINDINGS: The heart size and mediastinal contours are within normal limits. Both lungs are clear. The visualized skeletal structures are unremarkable.  IMPRESSION: No active cardiopulmonary disease.   Electronically Signed   By: Alcide CleverMark  Lukens M.D.   On: 10/20/2013 16:27    EKG Sinus tachycardia rate 118 Left bundle branch block No prior EKG for comparison MDM   Final diagnoses:  Tachycardia  Sciatica, unspecified laterality  Bundle branch block, left    Pt given pain meds in the ED. Heart rate has decreased with pain management.  Labs, cardiac enzymes and  D dimer were normal.  Suspect his tachycardia was related to his back pain.    I reviewed the notes from anesthesia.  They will require a cardiology consultation prior to his surgery.  They were hoping for evaluation in the ED.  Pt is scheduled for surgery on Wednesday.  I will consult with cardiology to see about evaluation in the ED.  Pt has very limited mobility because of the severity of his back pain.  2133  Pt was seen by cardiology.  PLan on admission for observation, plan on stress testing.  Linwood DibblesJon Sabree Nuon, MD 10/20/13 2133

## 2013-10-20 NOTE — Progress Notes (Addendum)
Anesthesia PAT Evaluation:  Patient is a 58 year old male scheduled for left sided L5-S1 microdiskectomy on 10/22/13 by Dr. Yevette Edwardsumonski.  History includes former smoker, heart murmur (not specified), nephrolithiasis, herniated disk, HTN (not currently on medication; off metoprolol for ~ 4 months), colonic polyps. He reported drinking 6/08 beers/night but quit 07/2012. He was seeing Dr. Vear ClockPhillips in [place] as a PCP, but was having issues with response time, billing, etc, so he stopped going to the practice and is scheduled to get newly established with with Dr. Rene PaciValerie Leschber on 01/21/14.  Most recently, he has primarily been seen at the Sports Medicine clinic by Dr. Antoine PrimasZachary Smith.    Medications: Tylenol, Flexeril, Neurontin, ibuprofen, Percocet.  CXR 10/20/13 showed: No active cardiopulmonary disease. PAT labs also noted.   HR was recorded as 148 bpm upon arrival to PAT. BP 161/95. RR 20. O2 Sat 95%. It was 122 bpm on [place] on 09/29/13, and was back in the 120's when rechecked by his PAT RN, but was 137 bpm with short PR and left BBB by PAT EKG.  There are no comparison EKGs in [facility].   I was called to evaluate patient following his PAT EKG.  On exam, he is lying on a gurney in obvious discomfort by facial grimaces and frequent position changes.  He denies chest pain, abdominal pain, SOB.  He reports severe "sciatic" pain involving his left buttocks, LLE.  Pain started about two months ago. Heart regular but tachycardic Abdomen was soft, NT.  No guarding.  Feet were warm to touch.    Patient with significant tachycardia and left BBB of undetermined duration.  He has been tachycardic since at least 09/29/13 based on previous vitals.   Although he denies chest pain, it's difficult to determine if there are any ischemic changes on EKG due to finding of left BBB.  I think he will need further evaluation in the ED, as it is now close to 5 PM and he does not currently have a PCP or cardiologist to  arrange out-patient urgent follow-up. (Anesthesiologist Dr. Chaney MallingHodierne agreed with this plan)  Patient will need HR control and preoperative cardiology evaluation (due to left BBB and tachycardia).  I notified the patient and Carla at Dr. Marshell Levanumonski's office of this.  I also told Martie LeeSabrina the ED charge nurse in hopes that cardiology could be consulted during his ED stay and/or admission.  Hopefully they can get LLE pain under control as well.  Our RN staff will transport patient to the ED.  Velna Ochsllison Bert Ptacek, PA-C [facility]/Anesthesiology Phone [phone] 10/20/2013 5:00 PM  Addendum: 10/21/2013 5:09 PM Patient was admitted from the ED last evening.  Troponin and d-dimer were negative.  He was seen by [facility] and underwent a nuclear stress test this afternoon that showed:  1. No ischemia. 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  2. LVEF 38%. Pronounced paradoxical septal motion. Hypokinesis of the basal and mid inferoseptal and inferior walls. An alternative method of LVEF evaluation is recommended.   It appears by notes in Epic, that patient is going to be discharged this evening.  He has been cleared for surgery tomorrow by Dr. Royann Shiversroitoru since his stress test was non-ischemic; however, he is being scheduled for an out-patient echo in the future.

## 2013-10-20 NOTE — H&P (Addendum)
Cardiology Consultation Note  Patient ID: Jacob Walters, MRN: [id number], DOB/AGE: Mar 20, 1956 58 y.o. Admit date: 10/20/2013   Date of Consult: 10/20/2013 Primary Physician: No primary provider on file.  Chief Complaint: back pain , tachycardia    Assessment and Plan:  Sinus tachycardia ( pt also with hx of possible SVT )  HTN uncontrolled  Pre-op cardiac assessment  Hypokalemia   Plan  Pt with risk factors HTN , smoker and difficult to assess functional capacity due to  pain from sciatica. Will obtain pharmacologic stress test .  Monitor for arryhtmias on tele  Start HCTZ 12.5QD and atenolol at 25mg  QD  Replete K and recheck renal panel   58 yr old male with hx of HTN, sciatica here with back pain and tachycardia   HPI: pt states that he was in pre-op clinic for his L5-S1 microdiskectomy on 10/22/13 by Dr. Yevette Edwardsumonski when his vitals showed HR w 148 bpm BP 161/95. RR 20. O2 Sat 95%. He was having severe left leg pain secondary to his sciatica. EKG showed sinus tach with LBBB. Pt denied any chest pain , SOB , orthopnea, PND , LE edema , Syncope ,claudcation , focal weakness, or bleeding diathesis .  States that his leg pain has been ongoing since early [place] and he has not been active since and does not exercise. 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 He has quit this since the past 1 year.  Pt states that he was prescribed atenolol a few yr ago due to abnormal heart rate. He has been unable to take this over the past 4 months since his PCP is retiring and did not refill his medication.  In the ED pt's heart rate improved with Normal saline and IV Dilaudid to about 100 bpm and is now about 120 bpm     Past Medical History  Diagnosis Date  . Heart murmur   . History of kidney stones   . Hx of colonic polyps   . Hypertension     was taking atenolol for several yrs., for BP &  arrymthmia - but has been off for 4 months.   . Chronic kidney disease     passed   spontaneously  . Arthritis     herniated disc- per pt.        Surgical History:  Past Surgical History  Procedure Laterality Date  . Colonoscopy       Home Meds: Prior to Admission medications   Medication Sig Start Date End Date Taking? Authorizing Provider  acetaminophen (TYLENOL) 500 MG tablet Take 1,000 mg by mouth every 6 (six) hours as needed.   Yes Historical Provider, MD  cyclobenzaprine (FLEXERIL) 10 MG tablet Take 10 mg by mouth 3 (three) times daily as needed for muscle spasms.   Yes Historical Provider, MD  oxyCODONE-acetaminophen (PERCOCET/ROXICET) 5-325 MG per tablet Take 1 tablet by mouth every 4 (four) hours as needed for severe pain.   Yes Historical Provider, MD    Inpatient Medications:    . sodium chloride 1,000 mL (10/20/13 1752)    Allergies: No Known Allergies  History   Social History  . Marital Status: Married    Spouse Name: N/A    Number of Children: N/A  . Years of Education: N/A   Occupational History  . Not on file.   Social History Main Topics  . Smoking status: Former Smoker -- 25 years    Quit date:  10/20/2012  . Smokeless tobacco: Not on file  . Alcohol Use: Yes     Comment: quit 07/2012- had been drinking 6-8 beers /night  . Drug Use: No  . Sexual Activity: Not on file   Other Topics Concern  . Not on file   Social History Narrative  . No narrative on file     Family History  Problem Relation Age of Onset  . Breast cancer Maternal Grandmother   . Alcohol abuse Other   . Diabetes Other      Review of Systems: General: negative for chills, fever, night sweats or weight changes.  Cardiovascular: see HPI                       Respiratory: negative for cough or wheezing Urologic: negative for hematuria Abdominal: negative for nausea, vomiting, diarrhea, bright red blood per rectum, melena, or hematemesis Neurologic: negative for visual changes, syncope, or dizziness All other systems reviewed and are otherwise negative  except as noted above.  Labs: No results found for this basename: CKTOTAL, CKMB, TROPONINI,  in the last 72 hours Lab Results  Component Value Date   WBC 8.6 10/20/2013   HGB 14.5 10/20/2013   HCT 42.7 10/20/2013   MCV 85.6 10/20/2013   PLT 199 10/20/2013    Recent Labs Lab 10/20/13 1756  NA 142  K 3.4*  CL 104  CO2 22  BUN 18  CREATININE 0.78  CALCIUM 9.0  PROT 7.0  BILITOT 0.2*  ALKPHOS 82  ALT 28  AST 15  GLUCOSE 110*   No results found for this basename: CHOL, HDL, LDLCALC, TRIG   Lab Results  Component Value Date   DDIMER <0.27 10/20/2013    Radiology/Studies:  Dg Chest 2 View  10/20/2013   CLINICAL DATA:  Preoperative evaluation for spine surgery  EXAM: CHEST  2 VIEW  COMPARISON:  None.  FINDINGS: The heart size and mediastinal contours are within normal limits. Both lungs are clear. The visualized skeletal structures are unremarkable.  IMPRESSION: No active cardiopulmonary disease.   Electronically Signed   By: Alcide CleverMark  Lukens M.D.   On: 10/20/2013 16:27    EKG:  Sinus tach , LBBB   Physical Exam: Blood pressure 165/99, pulse 99, temperature 97.7 F (36.5 C), temperature source Oral, resp. rate 23, height 5\' 10"  (1.778 m), weight 81.647 kg (180 lb), SpO2 100.00%. General: Well developed, well nourished, in no acute distress.  Neck: Negative for carotid bruits. JVD not elevated. Lungs: Clear bilaterally to auscultation without wheezes, rales, or rhonchi. Breathing is unlabored. Heart: RRR with S1 S2. No murmurs, rubs, or gallops appreciated. Abdomen: Soft, non-tender, non-distended with normoactive bowel sounds. No hepatomegaly. No rebound/guarding. No obvious abdominal masses. Extremities: No clubbing or cyanosis. No edema.  Distal pedal pulses are 2+ and equal bilaterally. Neuro: Alert and oriented X 3 Psych:  Responds to questions appropriately with a normal affect.    Lovina ReachSigned, Cristel Rail, A M.D  10/20/2013, 9:33 PM

## 2013-10-20 NOTE — Pre-Procedure Instructions (Signed)
Jacob Walters  10/20/2013   Your procedure is scheduled on:  10/22/2013  [facility] Admitting  ENTRANCE A at 10:00 AM.  Call this number if you have problems the morning of surgery: [phone]   Remember:   Do not eat food or drink liquids after midnight. On TUESDAY   Take these medicines the morning of surgery with A SIP OF WATER: Pain MEDICINE is OK if needed   Do not wear jewelry  Do not wear lotions, powders, or perfumes. You [place] wear deodorant.             Men [place] shave face and neck.  Do not bring valuables to the hospital.  [facility] is not responsible     for any belongings or valuables.               Contacts, dentures or bridgework [place] not be worn into surgery.  Leave suitcase in the car. After surgery it [place] be brought to your room.  For patients admitted to the hospital, discharge time is determined by your                treatment team.               Patients discharged the day of surgery will not be allowed to drive  home.  Name and phone number of your driver: with wife  Special Instructions: Special Instructions: [facility] - Preparing for Surgery  Before surgery, you can play an important role.  Because skin is not sterile, your skin needs to be as free of germs as possible.  You can reduce the number of germs on you skin by washing with CHG (chlorahexidine gluconate) soap before surgery.  CHG is an antiseptic cleaner which kills germs and bonds with the skin to continue killing germs even after washing.  Please DO NOT use if you have an allergy to CHG or antibacterial soaps.  If your skin becomes reddened/irritated stop using the CHG and inform your nurse when you arrive at Short Stay.  Do not shave (including legs and underarms) for at least 48 hours prior to the first CHG shower.  You [place] shave your face.  Please follow these instructions carefully:   1.  Shower with CHG Soap the night before surgery and the  morning of Surgery.  2.  If you  choose to wash your hair, wash your hair first as usual with your  normal shampoo.  3.  After you shampoo, rinse your hair and body thoroughly to remove the  Shampoo.  4.  Use CHG as you would any other liquid soap.  You can apply chg directly to the skin and wash gently with scrungie or a clean washcloth.  5.  Apply the CHG Soap to your body ONLY FROM THE NECK DOWN.    Do not use on open wounds or open sores.  Avoid contact with your eyes, ears, mouth and genitals (private parts).  Wash genitals (private parts)   with your normal soap.  6.  Wash thoroughly, paying special attention to the area where your surgery will be performed.  7.  Thoroughly rinse your body with warm water from the neck down.  8.  DO NOT shower/wash with your normal soap after using and rinsing off   the CHG Soap.  9.  Pat yourself dry with a clean towel.            10.  Wear clean  pajamas.            11.  Place clean sheets on your bed the night of your first shower and do not sleep with pets.  Day of Surgery  Do not apply any lotions/deodorants the morning of surgery.  Please wear clean clothes to the hospital/surgery center.   Please read over the following fact sheets that you were given: Pain Booklet, Coughing and Deep Breathing, Blood Transfusion Information, MRSA Information and Surgical Site Infection Prevention

## 2013-10-20 NOTE — ED Notes (Signed)
Patient reports having 1/2 sandwich at noon today.

## 2013-10-21 ENCOUNTER — Other Ambulatory Visit: Payer: Self-pay

## 2013-10-21 ENCOUNTER — Other Ambulatory Visit: Payer: Self-pay | Admitting: Physician Assistant

## 2013-10-21 ENCOUNTER — Telehealth: Payer: Self-pay

## 2013-10-21 ENCOUNTER — Encounter (HOSPITAL_COMMUNITY): Payer: Self-pay | Admitting: *Deleted

## 2013-10-21 ENCOUNTER — Encounter (HOSPITAL_COMMUNITY): Payer: BC Managed Care – PPO

## 2013-10-21 ENCOUNTER — Observation Stay (HOSPITAL_COMMUNITY): Payer: BC Managed Care – PPO

## 2013-10-21 ENCOUNTER — Telehealth: Payer: Self-pay | Admitting: Cardiovascular Disease

## 2013-10-21 DIAGNOSIS — I10 Essential (primary) hypertension: Secondary | ICD-10-CM

## 2013-10-21 DIAGNOSIS — I519 Heart disease, unspecified: Secondary | ICD-10-CM | POA: Diagnosis present

## 2013-10-21 DIAGNOSIS — I447 Left bundle-branch block, unspecified: Secondary | ICD-10-CM

## 2013-10-21 DIAGNOSIS — R079 Chest pain, unspecified: Secondary | ICD-10-CM

## 2013-10-21 DIAGNOSIS — E876 Hypokalemia: Secondary | ICD-10-CM | POA: Diagnosis present

## 2013-10-21 LAB — RENAL FUNCTION PANEL
Albumin: 3.4 g/dL — ABNORMAL LOW (ref 3.5–5.2)
Anion gap: 13 (ref 5–15)
BUN: 16 mg/dL (ref 6–23)
CALCIUM: 8.7 mg/dL (ref 8.4–10.5)
CO2: 25 mEq/L (ref 19–32)
CREATININE: 0.73 mg/dL (ref 0.50–1.35)
Chloride: 103 mEq/L (ref 96–112)
GFR calc non Af Amer: >90 mL/min (ref >90)
GLUCOSE: 109 mg/dL — AB (ref 70–99)
Phosphorus: 3.3 mg/dL (ref 2.3–4.6)
Potassium: 3.5 mEq/L — ABNORMAL LOW (ref 3.7–5.3)
SODIUM: 141 meq/L (ref 137–147)

## 2013-10-21 LAB — CBC
HEMATOCRIT: 38.8 % — AB (ref 39.0–52.0)
Hemoglobin: 13.1 g/dL (ref 13.0–17.0)
MCH: 29.2 pg (ref 26.0–34.0)
MCHC: 33.8 g/dL (ref 30.0–36.0)
MCV: 86.6 fL (ref 78.0–100.0)
Platelets: 207 10*3/uL (ref 150–400)
RBC: 4.48 MIL/uL (ref 4.22–5.81)
RDW: 13.6 % (ref 11.5–15.5)
WBC: 8.4 10*3/uL (ref 4.0–10.5)

## 2013-10-21 LAB — LIPID PANEL
CHOL/HDL RATIO: 2.2 ratio
Cholesterol: 108 mg/dL (ref 0–200)
HDL: 49 mg/dL (ref >39)
LDL Cholesterol: 46 mg/dL (ref 0–99)
Triglycerides: 64 mg/dL (ref <150)
VLDL: 13 mg/dL (ref 0–40)

## 2013-10-21 LAB — CREATININE, SERUM
Creatinine, Ser: 0.72 mg/dL (ref 0.50–1.35)
GFR calc Af Amer: >90 mL/min (ref >90)
GFR calc non Af Amer: >90 mL/min (ref >90)

## 2013-10-21 MED ORDER — POVIDONE-IODINE 7.5 % EX SOLN
Freq: Once | CUTANEOUS | Status: DC
  Filled 2013-10-21: qty 118

## 2013-10-21 MED ORDER — HYDROCHLOROTHIAZIDE 12.5 MG PO CAPS: 12.5000 mg | ORAL_CAPSULE | Freq: Every day | ORAL | Status: DC

## 2013-10-21 MED ORDER — ONDANSETRON HCL 4 MG/2ML IJ SOLN: 4.0000 mg | Freq: Four times a day (QID) | INTRAMUSCULAR | Status: DC | PRN

## 2013-10-21 MED ORDER — POTASSIUM CHLORIDE 20 MEQ PO PACK: 40.0000 meq | PACK | Freq: Once | ORAL | Status: DC

## 2013-10-21 MED ORDER — NITROGLYCERIN 0.4 MG SL SUBL: 0.4000 mg | SUBLINGUAL_TABLET | SUBLINGUAL | Status: DC | PRN

## 2013-10-21 MED ORDER — REGADENOSON 0.4 MG/5ML IV SOLN
0.4000 mg | Freq: Once | INTRAVENOUS | Status: AC
  Administered 2013-10-21: 0.4 mg via INTRAVENOUS
  Filled 2013-10-21: qty 5

## 2013-10-21 MED ORDER — POTASSIUM CHLORIDE CRYS ER 20 MEQ PO TBCR
40.0000 meq | EXTENDED_RELEASE_TABLET | Freq: Once | ORAL | Status: AC
  Administered 2013-10-21: 40 meq via ORAL
  Filled 2013-10-21: qty 2

## 2013-10-21 MED ORDER — REGADENOSON 0.4 MG/5ML IV SOLN
INTRAVENOUS | Status: AC
  Filled 2013-10-21: qty 5

## 2013-10-21 MED ORDER — LISINOPRIL 5 MG PO TABS
5.0000 mg | ORAL_TABLET | Freq: Every day | ORAL | Status: DC
Start: 2013-10-22 — End: 2013-10-21

## 2013-10-21 MED ORDER — ACETAMINOPHEN 500 MG PO TABS: 1000.0000 mg | ORAL_TABLET | Freq: Four times a day (QID) | ORAL | Status: DC | PRN

## 2013-10-21 MED ORDER — CEFAZOLIN SODIUM-DEXTROSE 2-3 GM-% IV SOLR
2.0000 g | INTRAVENOUS | Status: DC
  Filled 2013-10-21: qty 50

## 2013-10-21 MED ORDER — ATENOLOL 25 MG PO TABS: 25.0000 mg | ORAL_TABLET | Freq: Every day | ORAL | Status: DC

## 2013-10-21 MED ORDER — HYDROCHLOROTHIAZIDE 12.5 MG PO CAPS
12.5000 mg | ORAL_CAPSULE | Freq: Every day | ORAL | Status: DC
Start: 2013-10-21 — End: 2013-10-21
  Administered 2013-10-21: 12.5 mg via ORAL
  Filled 2013-10-21: qty 1

## 2013-10-21 MED ORDER — CYCLOBENZAPRINE HCL 10 MG PO TABS
10.0000 mg | ORAL_TABLET | Freq: Three times a day (TID) | ORAL | Status: DC | PRN
  Filled 2013-10-21 (×2): qty 1

## 2013-10-21 MED ORDER — LISINOPRIL 5 MG PO TABS: 5.0000 mg | ORAL_TABLET | Freq: Every day | ORAL | Status: DC

## 2013-10-21 MED ORDER — HYDROMORPHONE HCL PF 1 MG/ML IJ SOLN
1.0000 mg | INTRAMUSCULAR | Status: DC | PRN
  Administered 2013-10-21: 1 mg via INTRAVENOUS
  Filled 2013-10-21: qty 1

## 2013-10-21 NOTE — Discharge Summary (Signed)
Discharge Summary   Patient ID: Jacob Walters MRN: [id number], DOB/AGE: [birth year] 58 y.o. Admit date: 10/20/2013 D/C date:     10/21/2013  Primary Care Provider: No primary provider on file. Primary Cardiologist: New to [facility]  Primary Discharge Diagnoses:  1. Sinus tachycardia, suspect due in part to back pain and mild volume depletion 2. Possible LV dysfunction by nuclear stress test (EF 38%) but no ischemia, cleared for surgery 3. Leg/back pain/sciatica, awaiting L5-S1 microdiskectomy  4. LBBB 5. HTN, uncontrolled 6. Hypokalemia  Secondary Discharge Diagnoses:  1. Hx of kidney stones 2. Hx of colonic polyps 3. Arthritis 4. H/o EtOH abuse 5. H/o tobacco abuse  Hospital Course: Jacob Walters is a 58 y/o M with history of HTN (off meds for 4 months), kidney stones, and sciatica who presented yesterday to pre-op clinic for his L5-S1 microdiskectomy scheduled for 10/22/13 by Dr. Yevette Walters when his vitals showed HR 148 bpm BP 161/95, RR 20. O2 Sat 95%. At the time he was having severe left leg pain seconary his sciatica. EKG showed sinus tach with LBBB. He was sent to the ED for evaluation and pre-op risk assessment. He has not had any chest pain, SOB, orthopnea, PND, LE edema, syncope, claudication, focal weakness, or bleeding diathesis. Due to his leg pain, he has not been active and does not exercise. He is a former heavy alcohol user up to 8 beers per day and smoker, quit since the last 1 yr. He stated that he was prescribed atenolol a few years ago due to abnormal heart rate, however, said he had been unable to take this over the past 4 months since his PCP is retiring and did not refill his medication. In the ER, his heart rate improved with normal saline and dilaudid. Pulses were in tact. UA showed an elevated specific gravity suggesting some component of dehydration. It was felt that his pain and dehydration was likely contributing to sinus tach. CXR showed no active cardiopulmonary disease.  D-dimer was negative. Troponin was negative. Labwork was unremarkable except for mild hyperglycemia 111, mild hypokalemia which was repleted. Lipid panel was normal. Thyroid function was normal. Overnight his HR normalized into the 70s-90s.  He underwent Lexiscan nuclear stress testing showing "No ischemia. 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 LVEF 38%. Pronounced paradoxical septal motion. Hypokinesis of the basal and mid inferoseptal and inferior walls." Per Dr. Delton Walters the quality of the study was degraded thus it was difficult to tell if this was scar vs artifact. Jacob Walters felt the wall motion remarks Stuck be due to the patient's LBBB. Both Dr. Delton Walters and Jacob Walters felt this was a low risk study and have cleared him for surgery tomorrow. Jacob Walters has seen and examined the patient today and feels he is stable for discharge. With regard to blood pressure, he was started back on atenolol 25mg  daily. He was initially placed on HCTZ 12.5mg  daily, but due to possible decreased EF, we have also chosen to change this to lisinopril 5mg  daily. (I called [facility] to cancel HCTZ rx.) Will recheck BMET in 1 week to assess renal function and potassium level.  Discharge Vitals: Blood pressure 136/77, pulse 78, temperature 98.2 F (36.8 C), temperature source Oral, resp. rate 20, height 5\' 10"  (1.778 m), weight 182 lb 4.8 oz (82.691 kg), SpO2 98.00%.  Labs: Lab Results  Component Value Date   WBC 8.4 10/21/2013   HGB 13.1 10/21/2013   HCT 38.8* 10/21/2013  MCV 86.6 10/21/2013   PLT 207 10/21/2013     Recent Labs Lab 10/20/13 1756 10/21/13 0130  NA 142 141  K 3.4* 3.5*  CL 104 103  CO2 22 25  BUN 18 16  CREATININE 0.78 0.72  0.73  CALCIUM 9.0 8.7  PROT 7.0  --   BILITOT 0.2*  --   ALKPHOS 82  --   ALT 28  --   AST 15  --   GLUCOSE 110* 109*    Lab Results  Component Value Date   CHOL 108 10/21/2013   HDL 49 10/21/2013   LDLCALC 46 10/21/2013   TRIG 64  10/21/2013   Lab Results  Component Value Date   DDIMER <0.27 10/20/2013    Diagnostic Studies/Procedures   Dg Chest 2 View 10/20/2013   CLINICAL DATA:  Preoperative evaluation for spine surgery  EXAM: CHEST  2 VIEW  COMPARISON:  None.  FINDINGS: The heart size and mediastinal contours are within normal limits. Both lungs are clear. The visualized skeletal structures are unremarkable.  IMPRESSION: No active cardiopulmonary disease.   Electronically Signed   By: Jacob Walters M.D.   On: 10/20/2013 16:27   Nm Myocar Multi W/spect W/wall Motion / Ef 10/21/2013   CLINICAL DATA:  Chest pain  EXAM: Lexiscan Myovue  TECHNIQUE: The patient received IV Lexiscan .4mg  over 15 seconds. 33.0 mCi of Technetium 66m Sestamibi injected at 30 seconds. Quantitative SPECT images were obtained in the vertical, horizontal and short axis planes after a 45 minute delay. Rest images were obtained with similar planes and delay using 10.2 mCi of Technetium 20m Sestamibi.  FINDINGS: ECG:  SR, LBBB  Symptoms:  Dizziness, no chest pain  RAW Data:  LHR: 0.26  Quantitiative Gated SPECT EF: LVEF 38%, pronounced paradoxical septal motion. Hypokinesis of the basal and mid inferoseptal and inferior walls.  Perfusion Images: Apical thinning. Decreased uptake in the basal and mid inferolateral and basal inferior walls on stress and rest images.  IMPRESSION: 1. No ischemia. 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  2. LVEF 38%. Pronounced paradoxical septal motion. Hypokinesis of the basal and mid inferoseptal and inferior walls. An alternative method of LVEF evaluation is recommended.  Jacob Walters   Electronically Signed   By: Jacob Walters   On: 10/21/2013 15:43    Discharge Medications   Current Discharge Medication List    START taking these medications   Details  atenolol (TENORMIN) 25 MG tablet Take 1 tablet (25 mg total) by mouth daily. Qty: 30 tablet, Refills: 11    lisinopril  (PRINIVIL,ZESTRIL) 5 MG tablet Take 1 tablet (5 mg total) by mouth daily. Qty: 30 tablet, Refills: 6      CONTINUE these medications which have NOT CHANGED   Details  acetaminophen (TYLENOL) 500 MG tablet Take 1,000 mg by mouth every 6 (six) hours as needed.    cyclobenzaprine (FLEXERIL) 10 MG tablet Take 10 mg by mouth 3 (three) times daily as needed for muscle spasms.    oxyCODONE-acetaminophen (PERCOCET/ROXICET) 5-325 MG per tablet Take 1 tablet by mouth every 4 (four) hours as needed for severe pain.        Disposition   The patient will be discharged in stable condition to home. Discharge Instructions   Diet - low sodium heart healthy    Complete by:  As directed      Increase activity slowly    Complete by:  As directed   You  are cleared for surgery from a cardiac standpoint.  We will be checking a heart ultrasound as an outpatient to determine the strength of your heart muscle. Expect a call from Dr. Erin Hearingroitoru's office in the next 2-3 days.          Follow-up Information   Follow up with [facility],MIHAI, MD. (Our office will call you for your follow-up appointments including labwork and heart ultrasound within the next 1-2 weeks. Please call the office if you have not heard from us within 3 days.)    Specialty:  Cardiology   Contact information:   [address] [place] [address] [phone]         Duration of Discharge Encounter: Greater than 30 minutes including physician and PA time.  Signed, Ronie Spiesayna Nadege Carriger PA-C 10/21/2013, 4:41 PM

## 2013-10-21 NOTE — Telephone Encounter (Signed)
Dr Royann Shivers[facility]itoru [facility]  [facility]  Concerning [facility]

## 2013-10-21 NOTE — Progress Notes (Signed)
Patient Name: Jacob MarrowScott L Stengel Date of Encounter: 10/21/2013     Active Problems:   Sinus tachycardia   HTN (hypertension)   Hypokalemia   Pre-operative clearance    SUBJECTIVE  Denies any chest pain, SOB or dizziness. Feeling well this morning. Continue to have back pain, states he must have tomorrow surgery  CURRENT MEDS . atenolol  25 mg Oral Daily  .  ceFAZolin (ANCEF) IV  2 g Intravenous On Call to OR  . enoxaparin (LOVENOX) injection  40 mg Subcutaneous Q24H  . hydrochlorothiazide  12.5 mg Oral Daily  . povidone-iodine   Topical Once    OBJECTIVE  Filed Vitals:   10/21/13 0956 10/21/13 1014 10/21/13 1016 10/21/13 1018  BP: 149/81 151/62 148/73 142/73  Pulse: 78 91 111 109  Temp:      TempSrc:      Resp:      Height:      Weight:      SpO2:        Intake/Output Summary (Last 24 hours) at 10/21/13 1057 Last data filed at 10/21/13 0745  Gross per 24 hour  Intake      0 ml  Output    300 ml  Net   -300 ml   Filed Weights   10/20/13 1744 10/21/13 0023 10/21/13 0539  Weight: 180 lb (81.647 kg) 182 lb 4.8 oz (82.691 kg) 182 lb 4.8 oz (82.691 kg)    PHYSICAL EXAM  General: Pleasant, NAD. Neuro: Alert and oriented X 3. Moves all extremities spontaneously. Psych: Normal affect. HEENT:  Normal  Neck: Supple without bruits or JVD. Lungs:  Resp regular and unlabored, CTA. Heart: RRR no s3, s4, or murmurs. Abdomen: Soft, non-tender, non-distended, BS + x 4.  Extremities: No clubbing, cyanosis or edema. DP/PT/Radials 2+ and equal bilaterally.  Accessory Clinical Findings  CBC  Recent Labs  10/20/13 1619 10/20/13 1756 10/21/13 0130  WBC 11.0* 8.6 8.4  NEUTROABS 7.8*  --   --   HGB 15.5 14.5 13.1  HCT 45.6 42.7 38.8*  MCV 86.4 85.6 86.6  PLT 236 199 207   Basic Metabolic Panel  Recent Labs  10/20/13 1756 10/21/13 0130  NA 142 141  K 3.4* 3.5*  CL 104 103  CO2 22 25  GLUCOSE 110* 109*  BUN 18 16  CREATININE 0.78 0.72  0.73  CALCIUM 9.0 8.7    PHOS  --  3.3   Liver Function Tests  Recent Labs  10/20/13 1619 10/20/13 1756 10/21/13 0130  AST 17 15  --   ALT 30 28  --   ALKPHOS 88 82  --   BILITOT 0.3 0.2*  --   PROT 7.4 7.0  --   ALBUMIN 4.2 3.9 3.4*   D-Dimer  Recent Labs  10/20/13 1756  DDIMER <0.27   Fasting Lipid Panel  Recent Labs  10/21/13 0130  CHOL 108  HDL 49  LDLCALC 46  TRIG 64  CHOLHDL 2.2   Thyroid Function Tests  Recent Labs  10/20/13 1756  TSH 1.570    TELE  NSR with occasional brady in 40s and occasional tachy episode in 120s, largely in 80s without significant ventricular ectopy  ECG  Sinus tach with HR 110s, LBBB  Radiology/Studies  Dg Chest 2 View  10/20/2013   CLINICAL DATA:  Preoperative evaluation for spine surgery  EXAM: CHEST  2 VIEW  COMPARISON:  None.  FINDINGS: The heart size and mediastinal contours are within normal limits. Both lungs  are clear. The visualized skeletal structures are unremarkable.  IMPRESSION: No active cardiopulmonary disease.   Electronically Signed   By: Alcide CleverMark  Lukens M.D.   On: 10/20/2013 16:27    ASSESSMENT AND PLAN  1. Scheduled left sided L5-S1 microdiskectomy on 10/22/13 by Dr. Yevette Edwardsumonski  2. Pre-op cardiac assessment for above  - underwent Lexiscan stress test on 10/21/13  - pending result  3. Uncontrolled HTN  - better after starting HCTZ and atenolol  4. Sinus tachycardia  5. Hypokalemia: replete with KCl  6. LBBB  Signed, Azalee CourseMeng, Hao PA-C  I have seen and examined the patient along with Azalee CourseMeng, Hao PA-C.  I have reviewed the chart, notes and new data.  I agree with PA's note.  Key new complaints: no chest pain, severe back pain persists Key examination changes: normal CV exam except paradoxically split S2 Key new findings / data: nuclear scans completed, not yet processed  PLAN: If nuclear images are normal or show a fixed septal defect, low risk for planned lumbar spine surgery tomorrow. If reversible defects are seen, plan  coronary angio ASAP and will probably need to defer surgery.  Thurmon FairMihai Evalyn Shultis, MD, [facility]ACC [facility] [phone] 10/21/2013, 12:38 PM

## 2013-10-21 NOTE — Telephone Encounter (Signed)
Clinical information only

## 2013-10-21 NOTE — Telephone Encounter (Signed)
Informed Dr. Felicity CoyerLeschber of pt score of 5 making the pt a greater risk for sleep apnea.

## 2013-10-21 NOTE — Progress Notes (Signed)
Nutrition Brief Note  Patient identified on the Malnutrition Screening Tool (MST) Report for unintentional weight loss. 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 Since 2 months ago he has been unable to work and has not been eating fast food, therefore, has lost weight. He has been eating well at home, just not quite as much as usual.  Wt Readings from Last 15 Encounters:  10/21/13 182 lb 4.8 oz (82.691 kg)  10/20/13 180 lb 14.4 oz (82.056 kg)  09/29/13 185 lb (83.915 kg)  09/16/13 190 lb (86.183 kg)  09/04/13 195 lb (88.451 kg)  08/26/13 201 lb (91.173 kg)    Body mass index is 26.16 kg/(m^2). Patient meets criteria for overweight based on current BMI.   Current diet order is heart healthy, patient's wife is also bringing in meals for patient. He reports that he is eating very well. Labs and medications reviewed.   No nutrition interventions warranted at this time. If nutrition issues arise, please consult RD.    Joaquin CourtsKimberly Krystina Strieter, RD, LDN, CNSC Pager [phone] After Hours Pager [phone]

## 2013-10-22 ENCOUNTER — Ambulatory Visit (HOSPITAL_COMMUNITY): Payer: BC Managed Care – PPO

## 2013-10-22 ENCOUNTER — Other Ambulatory Visit: Payer: Self-pay | Admitting: Orthopedic Surgery

## 2013-10-22 ENCOUNTER — Encounter (HOSPITAL_COMMUNITY): Payer: Self-pay | Admitting: *Deleted

## 2013-10-22 ENCOUNTER — Ambulatory Visit (HOSPITAL_COMMUNITY)
Admission: RE | Admit: 2013-10-22 | Discharge: 2013-10-22 | Disposition: A | Discharge status: Home / Self Care | Payer: BC Managed Care – PPO | Source: Ambulatory Visit | Attending: Orthopedic Surgery | Admitting: Orthopedic Surgery

## 2013-10-22 ENCOUNTER — Encounter (HOSPITAL_COMMUNITY)
Admission: RE | Disposition: A | Discharge status: Home / Self Care | Payer: Self-pay | Source: Ambulatory Visit | Attending: Orthopedic Surgery

## 2013-10-22 ENCOUNTER — Ambulatory Visit (HOSPITAL_COMMUNITY): Payer: BC Managed Care – PPO | Admitting: Anesthesiology

## 2013-10-22 ENCOUNTER — Encounter (HOSPITAL_COMMUNITY): Payer: BC Managed Care – PPO | Admitting: Vascular Surgery

## 2013-10-22 ENCOUNTER — Telehealth: Payer: Self-pay | Admitting: Cardiovascular Disease

## 2013-10-22 DIAGNOSIS — F1011 Alcohol abuse, in remission: Secondary | ICD-10-CM | POA: Insufficient documentation

## 2013-10-22 DIAGNOSIS — I498 Other specified cardiac arrhythmias: Secondary | ICD-10-CM | POA: Insufficient documentation

## 2013-10-22 DIAGNOSIS — I10 Essential (primary) hypertension: Secondary | ICD-10-CM | POA: Insufficient documentation

## 2013-10-22 DIAGNOSIS — I447 Left bundle-branch block, unspecified: Secondary | ICD-10-CM | POA: Insufficient documentation

## 2013-10-22 DIAGNOSIS — Z79899 Other long term (current) drug therapy: Secondary | ICD-10-CM | POA: Insufficient documentation

## 2013-10-22 DIAGNOSIS — Z860100 Personal history of colon polyps, unspecified: Secondary | ICD-10-CM | POA: Insufficient documentation

## 2013-10-22 DIAGNOSIS — Z87891 Personal history of nicotine dependence: Secondary | ICD-10-CM | POA: Insufficient documentation

## 2013-10-22 DIAGNOSIS — M5126 Other intervertebral disc displacement, lumbar region: Secondary | ICD-10-CM | POA: Insufficient documentation

## 2013-10-22 DIAGNOSIS — I252 Old myocardial infarction: Secondary | ICD-10-CM | POA: Insufficient documentation

## 2013-10-22 HISTORY — PX: LUMBAR LAMINECTOMY/DECOMPRESSION MICRODISCECTOMY: SHX5026

## 2013-10-22 SURGERY — LUMBAR LAMINECTOMY/DECOMPRESSION MICRODISCECTOMY 1 LEVEL
Anesthesia: General | Site: Spine Lumbar | Laterality: Left

## 2013-10-22 MED ORDER — DIAZEPAM 5 MG PO TABS
5.0000 mg | ORAL_TABLET | Freq: Once | ORAL | Status: AC
  Administered 2013-10-22: 5 mg via ORAL

## 2013-10-22 MED ORDER — ONDANSETRON HCL 4 MG/2ML IJ SOLN
INTRAMUSCULAR | Status: AC
  Filled 2013-10-22: qty 2

## 2013-10-22 MED ORDER — STERILE WATER FOR INJECTION IJ SOLN
INTRAMUSCULAR | Status: AC
  Filled 2013-10-22: qty 10

## 2013-10-22 MED ORDER — LIDOCAINE HCL (CARDIAC) 20 MG/ML IV SOLN
INTRAVENOUS | Status: DC | PRN
  Administered 2013-10-22: 50 mg via INTRAVENOUS

## 2013-10-22 MED ORDER — PROPOFOL 10 MG/ML IV BOLUS
INTRAVENOUS | Status: DC | PRN
  Administered 2013-10-22: 110 mg via INTRAVENOUS

## 2013-10-22 MED ORDER — LACTATED RINGERS IV SOLN
INTRAVENOUS | Status: DC | PRN
  Administered 2013-10-22 (×2): via INTRAVENOUS

## 2013-10-22 MED ORDER — SODIUM CHLORIDE 0.9 % IJ SOLN
INTRAMUSCULAR | Status: AC
  Filled 2013-10-22: qty 10

## 2013-10-22 MED ORDER — NEOSTIGMINE METHYLSULFATE 10 MG/10ML IV SOLN
INTRAVENOUS | Status: AC
  Filled 2013-10-22: qty 1

## 2013-10-22 MED ORDER — SUFENTANIL CITRATE 50 MCG/ML IV SOLN
INTRAVENOUS | Status: AC
  Filled 2013-10-22: qty 1

## 2013-10-22 MED ORDER — ROCURONIUM BROMIDE 100 MG/10ML IV SOLN
INTRAVENOUS | Status: DC | PRN
  Administered 2013-10-22: 20 mg via INTRAVENOUS
  Administered 2013-10-22: 40 mg via INTRAVENOUS
  Administered 2013-10-22: 20 mg via INTRAVENOUS

## 2013-10-22 MED ORDER — THROMBIN 20000 UNITS EX SOLR
CUTANEOUS | Status: DC | PRN
  Administered 2013-10-22: 20000 [IU] via TOPICAL

## 2013-10-22 MED ORDER — HYDROMORPHONE HCL PF 1 MG/ML IJ SOLN
INTRAMUSCULAR | Status: AC
  Administered 2013-10-22: 0.5 mg via INTRAVENOUS
  Filled 2013-10-22: qty 1

## 2013-10-22 MED ORDER — LIDOCAINE HCL 4 % MT SOLN
OROMUCOSAL | Status: DC | PRN
  Administered 2013-10-22: 4 mL via TOPICAL

## 2013-10-22 MED ORDER — LACTATED RINGERS IV SOLN
INTRAVENOUS | Status: DC
  Administered 2013-10-22: 11:00:00 via INTRAVENOUS

## 2013-10-22 MED ORDER — MIDAZOLAM HCL 2 MG/2ML IJ SOLN
INTRAMUSCULAR | Status: AC
  Filled 2013-10-22: qty 2

## 2013-10-22 MED ORDER — DEXAMETHASONE SODIUM PHOSPHATE 4 MG/ML IJ SOLN
INTRAMUSCULAR | Status: DC | PRN
Start: 2013-10-22 — End: 2013-10-22
  Administered 2013-10-22: 4 mg via INTRAVENOUS

## 2013-10-22 MED ORDER — PROPOFOL 10 MG/ML IV BOLUS
INTRAVENOUS | Status: AC
  Filled 2013-10-22: qty 20

## 2013-10-22 MED ORDER — HYDROMORPHONE HCL PF 1 MG/ML IJ SOLN
0.2500 mg | INTRAMUSCULAR | Status: DC | PRN
  Administered 2013-10-22 (×2): 0.5 mg via INTRAVENOUS

## 2013-10-22 MED ORDER — METHYLPREDNISOLONE ACETATE 40 MG/ML IJ SUSP
INTRAMUSCULAR | Status: AC
  Filled 2013-10-22: qty 1

## 2013-10-22 MED ORDER — 0.9 % SODIUM CHLORIDE (POUR BTL) OPTIME
TOPICAL | Status: DC | PRN
  Administered 2013-10-22: 1000 mL

## 2013-10-22 MED ORDER — ONDANSETRON HCL 4 MG/2ML IJ SOLN
INTRAMUSCULAR | Status: DC | PRN
  Administered 2013-10-22: 4 mg via INTRAVENOUS

## 2013-10-22 MED ORDER — GLYCOPYRROLATE 0.2 MG/ML IJ SOLN
INTRAMUSCULAR | Status: AC
  Filled 2013-10-22: qty 1

## 2013-10-22 MED ORDER — NEOSTIGMINE METHYLSULFATE 10 MG/10ML IV SOLN
INTRAVENOUS | Status: DC | PRN
  Administered 2013-10-22: 3 mg via INTRAVENOUS

## 2013-10-22 MED ORDER — PHENYLEPHRINE HCL 10 MG/ML IJ SOLN
10.0000 mg | INTRAMUSCULAR | Status: DC | PRN
  Administered 2013-10-22: 20 ug/min via INTRAVENOUS

## 2013-10-22 MED ORDER — METHYLPREDNISOLONE ACETATE 40 MG/ML IJ SUSP
INTRAMUSCULAR | Status: DC | PRN
  Administered 2013-10-22: 40 mg

## 2013-10-22 MED ORDER — THROMBIN 20000 UNITS EX SOLR
CUTANEOUS | Status: AC
  Filled 2013-10-22: qty 40000

## 2013-10-22 MED ORDER — GLYCOPYRROLATE 0.2 MG/ML IJ SOLN
INTRAMUSCULAR | Status: AC
  Filled 2013-10-22: qty 2

## 2013-10-22 MED ORDER — DIAZEPAM 5 MG PO TABS
ORAL_TABLET | ORAL | Status: AC
  Filled 2013-10-22: qty 1

## 2013-10-22 MED ORDER — BUPIVACAINE-EPINEPHRINE 0.25% -1:200000 IJ SOLN
INTRAMUSCULAR | Status: DC | PRN
  Administered 2013-10-22: 20 mL

## 2013-10-22 MED ORDER — ROCURONIUM BROMIDE 50 MG/5ML IV SOLN
INTRAVENOUS | Status: AC
  Filled 2013-10-22: qty 2

## 2013-10-22 MED ORDER — ARTIFICIAL TEARS OP OINT
TOPICAL_OINTMENT | OPHTHALMIC | Status: DC | PRN
  Administered 2013-10-22: 1 via OPHTHALMIC

## 2013-10-22 MED ORDER — METHYLENE BLUE 1 % INJ SOLN
INTRAMUSCULAR | Status: DC | PRN
  Administered 2013-10-22: .3 mL via SUBMUCOSAL

## 2013-10-22 MED ORDER — OXYCODONE HCL 5 MG PO TABS
ORAL_TABLET | ORAL | Status: AC
  Filled 2013-10-22: qty 1

## 2013-10-22 MED ORDER — MIDAZOLAM HCL 5 MG/5ML IJ SOLN
INTRAMUSCULAR | Status: DC | PRN
  Administered 2013-10-22: 2 mg via INTRAVENOUS

## 2013-10-22 MED ORDER — EPHEDRINE SULFATE 50 MG/ML IJ SOLN
INTRAMUSCULAR | Status: AC
  Filled 2013-10-22: qty 1

## 2013-10-22 MED ORDER — SUFENTANIL CITRATE 50 MCG/ML IV SOLN
INTRAVENOUS | Status: DC | PRN
  Administered 2013-10-22 (×2): 5 ug via INTRAVENOUS
  Administered 2013-10-22: 10 ug via INTRAVENOUS

## 2013-10-22 MED ORDER — GLYCOPYRROLATE 0.2 MG/ML IJ SOLN
INTRAMUSCULAR | Status: DC | PRN
  Administered 2013-10-22: 0.4 mg via INTRAVENOUS
  Administered 2013-10-22: 0.2 mg via INTRAVENOUS

## 2013-10-22 MED ORDER — METHYLENE BLUE 1 % INJ SOLN
INTRAMUSCULAR | Status: AC
  Filled 2013-10-22: qty 10

## 2013-10-22 MED ORDER — CEFAZOLIN SODIUM-DEXTROSE 2-3 GM-% IV SOLR
INTRAVENOUS | Status: DC | PRN
  Administered 2013-10-22: 2 g via INTRAVENOUS

## 2013-10-22 MED ORDER — OXYCODONE HCL 5 MG PO TABS
5.0000 mg | ORAL_TABLET | Freq: Once | ORAL | Status: AC | PRN
  Administered 2013-10-22: 5 mg via ORAL

## 2013-10-22 MED ORDER — BUPIVACAINE-EPINEPHRINE (PF) 0.25% -1:200000 IJ SOLN
INTRAMUSCULAR | Status: AC
  Filled 2013-10-22: qty 30

## 2013-10-22 MED ORDER — ARTIFICIAL TEARS OP OINT
TOPICAL_OINTMENT | OPHTHALMIC | Status: AC
  Filled 2013-10-22: qty 3.5

## 2013-10-22 MED ORDER — OXYCODONE HCL 5 MG/5ML PO SOLN: 5.0000 mg | Freq: Once | ORAL | Status: AC | PRN

## 2013-10-22 MED ORDER — ONDANSETRON HCL 4 MG/2ML IJ SOLN: 4.0000 mg | Freq: Once | INTRAMUSCULAR | Status: DC | PRN

## 2013-10-22 MED ORDER — LIDOCAINE HCL (CARDIAC) 20 MG/ML IV SOLN
INTRAVENOUS | Status: AC
  Filled 2013-10-22: qty 5

## 2013-10-22 SURGICAL SUPPLY — 69 items
BENZOIN TINCTURE PRP APPL 2/3 (GAUZE/BANDAGES/DRESSINGS) ×3 IMPLANT
BIT DRILL NEURO 2X3.1 SFT TUCH (MISCELLANEOUS) ×1 IMPLANT
BLADE SURG 15 STRL LF DISP TIS (BLADE) ×1 IMPLANT
BLADE SURG 15 STRL SS (BLADE) ×2
BLADE SURG ROTATE 9660 (MISCELLANEOUS) ×3 IMPLANT
BUR MATCHSTICK NEURO 3.0 LAGG (BURR) IMPLANT
BUR ROUND PRECISION 4.0 (BURR) ×2 IMPLANT
BUR ROUND PRECISION 4.0MM (BURR) ×1
CARTRIDGE OIL MAESTRO DRILL (MISCELLANEOUS) ×1 IMPLANT
CLOSURE STERI-STRIP 1/2X4 (GAUZE/BANDAGES/DRESSINGS) ×2
CLOSURE WOUND 1/2 X4 (GAUZE/BANDAGES/DRESSINGS) ×1
CLSR STERI-STRIP ANTIMIC 1/2X4 (GAUZE/BANDAGES/DRESSINGS) ×4 IMPLANT
CORDS BIPOLAR (ELECTRODE) ×3 IMPLANT
COVER SURGICAL LIGHT HANDLE (MISCELLANEOUS) ×3 IMPLANT
CRADLE DONUT ADULT HEAD (MISCELLANEOUS) ×3 IMPLANT
DIFFUSER DRILL AIR PNEUMATIC (MISCELLANEOUS) ×3 IMPLANT
DRAIN JACKSON RD 7FR 3/32 (WOUND CARE) IMPLANT
DRAPE C-ARM 42X72 X-RAY (DRAPES) ×3 IMPLANT
DRAPE POUCH INSTRU U-SHP 10X18 (DRAPES) ×6 IMPLANT
DRAPE SURG 17X23 STRL (DRAPES) ×9 IMPLANT
DRILL NEURO 2X3.1 SOFT TOUCH (MISCELLANEOUS) ×3
DURAPREP 26ML APPLICATOR (WOUND CARE) ×3 IMPLANT
ELECT COATED BLADE 2.86 ST (ELECTRODE) ×3 IMPLANT
ELECT REM PT RETURN 9FT ADLT (ELECTROSURGICAL) ×3
ELECTRODE REM PT RTRN 9FT ADLT (ELECTROSURGICAL) ×1 IMPLANT
EVACUATOR SILICONE 100CC (DRAIN) IMPLANT
GAUZE SPONGE 4X4 16PLY XRAY LF (GAUZE/BANDAGES/DRESSINGS) ×3 IMPLANT
GLOVE BIO SURGEON STRL SZ7 (GLOVE) ×3 IMPLANT
GLOVE BIO SURGEON STRL SZ8 (GLOVE) ×3 IMPLANT
GLOVE BIOGEL PI IND STRL 7.0 (GLOVE) ×2 IMPLANT
GLOVE BIOGEL PI IND STRL 8 (GLOVE) ×1 IMPLANT
GLOVE BIOGEL PI INDICATOR 7.0 (GLOVE) ×4
GLOVE BIOGEL PI INDICATOR 8 (GLOVE) ×2
GOWN STRL REUS W/ TWL LRG LVL3 (GOWN DISPOSABLE) ×1 IMPLANT
GOWN STRL REUS W/ TWL XL LVL3 (GOWN DISPOSABLE) ×1 IMPLANT
GOWN STRL REUS W/TWL LRG LVL3 (GOWN DISPOSABLE) ×2
GOWN STRL REUS W/TWL XL LVL3 (GOWN DISPOSABLE) ×2
IV CATH 14GX2 1/4 (CATHETERS) ×3 IMPLANT
KIT BASIN OR (CUSTOM PROCEDURE TRAY) ×3 IMPLANT
KIT ROOM TURNOVER OR (KITS) ×3 IMPLANT
MANIFOLD NEPTUNE II (INSTRUMENTS) ×3 IMPLANT
NEEDLE 27GAX1X1/2 (NEEDLE) ×3 IMPLANT
NEEDLE SPNL 20GX3.5 QUINCKE YW (NEEDLE) ×3 IMPLANT
NS IRRIG 1000ML POUR BTL (IV SOLUTION) ×3 IMPLANT
OIL CARTRIDGE MAESTRO DRILL (MISCELLANEOUS) ×3
PACK ORTHO CERVICAL (CUSTOM PROCEDURE TRAY) ×3 IMPLANT
PAD ARMBOARD 7.5X6 YLW CONV (MISCELLANEOUS) ×6 IMPLANT
PATTIES SURGICAL .5 X.5 (GAUZE/BANDAGES/DRESSINGS) IMPLANT
PATTIES SURGICAL .5 X1 (DISPOSABLE) IMPLANT
SPONGE GAUZE 4X4 12PLY (GAUZE/BANDAGES/DRESSINGS) ×3 IMPLANT
SPONGE GAUZE 4X4 12PLY STER LF (GAUZE/BANDAGES/DRESSINGS) ×3 IMPLANT
SPONGE INTESTINAL PEANUT (DISPOSABLE) ×3 IMPLANT
SPONGE SURGIFOAM ABS GEL 100 (HEMOSTASIS) ×3 IMPLANT
STRIP CLOSURE SKIN 1/2X4 (GAUZE/BANDAGES/DRESSINGS) ×2 IMPLANT
SURGIFLO TRUKIT (HEMOSTASIS) IMPLANT
SUT MNCRL AB 4-0 PS2 18 (SUTURE) IMPLANT
SUT SILK 4 0 (SUTURE)
SUT SILK 4-0 18XBRD TIE 12 (SUTURE) IMPLANT
SUT VIC AB 2-0 CT2 18 VCP726D (SUTURE) ×3 IMPLANT
SYR BULB IRRIGATION 50ML (SYRINGE) ×3 IMPLANT
SYR CONTROL 10ML LL (SYRINGE) ×3 IMPLANT
TAPE CLOTH 4X10 WHT NS (GAUZE/BANDAGES/DRESSINGS) ×3 IMPLANT
TAPE CLOTH SURG 4X10 WHT LF (GAUZE/BANDAGES/DRESSINGS) ×3 IMPLANT
TAPE UMBILICAL COTTON 1/8X30 (MISCELLANEOUS) ×6 IMPLANT
TOWEL OR 17X24 6PK STRL BLUE (TOWEL DISPOSABLE) ×3 IMPLANT
TOWEL OR 17X26 10 PK STRL BLUE (TOWEL DISPOSABLE) ×3 IMPLANT
TRAY FOLEY CATH 16FRSI W/METER (SET/KITS/TRAYS/PACK) ×3 IMPLANT
WATER STERILE IRR 1000ML POUR (IV SOLUTION) ×3 IMPLANT
YANKAUER SUCT BULB TIP NO VENT (SUCTIONS) ×3 IMPLANT

## 2013-10-22 NOTE — Anesthesia Postprocedure Evaluation (Signed)
Anesthesia Post Note  Patient: Jacob Walters  Procedure(s) Performed: Procedure(s) (LRB): LUMBAR LAMINECTOMY/DECOMPRESSION MICRODISCECTOMY 1 LEVEL (Left)  Anesthesia type: general  Patient location: PACU  Post pain: Pain level controlled  Post assessment: Patient's Cardiovascular Status Stable  Last Vitals:  Filed Vitals:   10/22/13 1730  BP: 123/62  Pulse: 64  Temp:   Resp: 12    Post vital signs: Reviewed and stable  Level of consciousness: sedated  Complications: No apparent anesthesia complications

## 2013-10-22 NOTE — Anesthesia Preprocedure Evaluation (Addendum)
Anesthesia Evaluation  Patient identified by MRN, date of birth, ID band Patient awake    Reviewed: Allergy & Precautions, H&P , NPO status , Patient's Chart, lab work & pertinent test results, reviewed documented beta blocker date and time   Airway Mallampati: I TM Distance: >3 FB Neck ROM: Full    Dental  (+) Teeth Intact, Dental Advisory Given   Pulmonary former smoker,  breath sounds clear to auscultation        Cardiovascular hypertension, Pt. on medications and Pt. on home beta blockers + Past MI Rhythm:Regular Rate:Normal  1. No ischemia. 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   2. LVEF 38%. Pronounced paradoxical septal motion. Hypokinesis of the basal and mid inferoseptal and inferior walls. An alternative method of LVEF evaluation is recommended.     Neuro/Psych    GI/Hepatic   Endo/Other    Renal/GU      Musculoskeletal   Abdominal   Peds  Hematology   Anesthesia Other Findings   Reproductive/Obstetrics                         Anesthesia Physical Anesthesia Plan  ASA: III  Anesthesia Plan: General   Post-op Pain Management:    Induction: Intravenous  Airway Management Planned: Oral ETT  Additional Equipment:   Intra-op Plan:   Post-operative Plan: Extubation in OR  Informed Consent: I have reviewed the patients History and Physical, chart, labs and discussed the procedure including the risks, benefits and alternatives for the proposed anesthesia with the patient or authorized representative who has indicated his/her understanding and acceptance.   Dental advisory given  Plan Discussed with: CRNA, Anesthesiologist and Surgeon  Anesthesia Plan Comments:        Anesthesia Quick Evaluation

## 2013-10-22 NOTE — H&P (Signed)
PREOPERATIVE H&P  Chief Complaint: left leg pain  HPI: Jacob Walters is a 58 y.o. male who presents with ongoing pain in the left leg   MRI reveals large left L5/S1 HNP  Patient has failed multiple forms of conservative care and continues to have pain (see office notes for additional details regarding the patient's full course of treatment)  Past Medical History  Diagnosis Date  . History of kidney stones   . Hx of colonic polyps   . Hypertension   . Arthritis     herniated disc- per pt.   . H/O ETOH abuse   . H/O tobacco use, presenting hazards to health   . LBBB (left bundle branch block)   . Sinus tachycardia     a. Adm 10/2013 - ? due to back/leg pain and mild volume depletion. Previously on atenolol in the past for HR per patient, resumed during this adm (neg d-dimer, normal TSH).  . LV dysfunction     a. Nuc 10/2013: no ischemia, EF 38% with paradoxical septal motion. Hypokinesis of the basal and mid inferoseptal and inferior walls. Difficult study. Unclear if WMA due to LBBB. Echo to follow as outpatient.  Marland Kitchen. HTN (hypertension)    Past Surgical History  Procedure Laterality Date  . Colonoscopy     History   Social History  . Marital Status: Married    Spouse Name: N/A    Number of Children: N/A  . Years of Education: N/A   Social History Main Topics  . Smoking status: Former Smoker -- 25 years    Quit date: 10/20/2012  . Smokeless tobacco: Not on file  . Alcohol Use: Yes     Comment: quit 07/2012- had been drinking 6-8 beers /night  . Drug Use: No  . Sexual Activity: Not on file   Other Topics Concern  . Not on file   Social History Narrative  . No narrative on file   Family History  Problem Relation Age of Onset  . Breast cancer Maternal Grandmother   . Alcohol abuse Other   . Diabetes Other    No Known Allergies Prior to Admission medications   Medication Sig Start Date End Date Taking? Authorizing Provider  cyclobenzaprine (FLEXERIL) 10 MG  tablet Take 10 mg by mouth 3 (three) times daily as needed for muscle spasms.   Yes Historical Provider, MD  oxyCODONE-acetaminophen (PERCOCET/ROXICET) 5-325 MG per tablet Take 1 tablet by mouth every 4 (four) hours as needed for severe pain.   Yes Historical Provider, MD  acetaminophen (TYLENOL) 500 MG tablet Take 1,000 mg by mouth every 6 (six) hours as needed.    Historical Provider, MD  atenolol (TENORMIN) 25 MG tablet Take 1 tablet (25 mg total) by mouth daily. 10/21/13   Abelino DerrickLuke K Kilroy, PA-C  lisinopril (PRINIVIL,ZESTRIL) 5 MG tablet Take 1 tablet (5 mg total) by mouth daily. 10/22/13   Dayna N Dunn, PA-C     All other systems have been reviewed and were otherwise negative with the exception of those mentioned in the HPI and as above.  Physical Exam: There were no vitals filed for this visit.  General: Alert, no acute distress Cardiovascular: No pedal edema Respiratory: No cyanosis, no use of accessory musculature Skin: No lesions in the area of chief complaint Neurologic: Sensation intact distally Psychiatric: Patient is competent for consent with normal mood and affect Lymphatic: No axillary or cervical lymphadenopathy  MUSCULOSKELETAL: + SLR on left  Assessment/Plan: Left leg  pain Plan for Procedure(s): LUMBAR LAMINECTOMY/DECOMPRESSION MICRODISCECTOMY 1 LEVEL   Emilee HeroUMONSKI,Balbina Depace LEONARD, MD 10/22/2013 7:48 AM

## 2013-10-22 NOTE — Anesthesia Procedure Notes (Signed)
Date/Time: 10/22/2013 1:55 PM Performed by: Izora Gala Pre-anesthesia Checklist: Patient identified, Emergency Drugs available, Suction available and Patient being monitored Oxygen Delivery Method: Circle system utilized Preoxygenation: Pre-oxygenation with 100% oxygen Intubation Type: IV induction Ventilation: Mask ventilation without difficulty Laryngoscope Size: Miller and 3 Grade View: Grade II Tube type: Oral Number of attempts: 1 Airway Equipment and Method: Stylet and LTA kit utilized Placement Confirmation: ETT inserted through vocal cords under direct vision,  positive ETCO2 and breath sounds checked- equal and bilateral Secured at: 23 cm Tube secured with: Tape Dental Injury: Teeth and Oropharynx as per pre-operative assessment

## 2013-10-22 NOTE — Telephone Encounter (Signed)
Closed encounter °

## 2013-10-22 NOTE — Transfer of Care (Signed)
Immediate Anesthesia Transfer of Care Note  Patient: Jacob Walters  Procedure(s) Performed: Procedure(s) with comments: LUMBAR LAMINECTOMY/DECOMPRESSION MICRODISCECTOMY 1 LEVEL (Left) - Left sided lumbar 5-sacrum 1 microdisectomy  Patient Location: PACU  Anesthesia Type:General  Level of Consciousness: awake, alert , oriented, patient cooperative and responds to stimulation  Airway & Oxygen Therapy: Patient Spontanous Breathing and Patient connected to nasal cannula oxygen  Post-op Assessment: Report given to PACU RN, Post -op Vital signs reviewed and stable and Patient moving all extremities X 4  Post vital signs: Reviewed and stable  Complications: No apparent anesthesia complications

## 2013-10-23 NOTE — Op Note (Signed)
NAME:  De BurrsMAY, Labib                   ACCOUNT NO.:  [id number]  MEDICAL RECORD NO.:  [id number]  LOCATION:  [facility]                         FACILITY:  [facility]  PHYSICIAN:  Estill BambergMark Rhys Lichty, MD      DATE OF BIRTH:  [birth year]  DATE OF PROCEDURE:  10/22/2013 DATE OF DISCHARGE:  10/22/2013                              OPERATIVE REPORT   PREOPERATIVE DIAGNOSIS: 1. Left-sided S1 radiculopathy. 2. Left L5-S1 disk herniation.  POSTOPERATIVE DIAGNOSIS: 1. Left-sided S1 radiculopathy. 2. Left L5-S1 disk herniation.  PROCEDURES:  Left-sided L5-S1 laminotomy with partial facetectomy and removal of left L5-S1 disk fragment.  SURGEONS:  Estill BambergMark Remus Hagedorn, MD.  ASSISTANJason Coop:  Kayla McKenzie, [facility]  ANESTHESIA:  General endotracheal anesthesia.  COMPLICATIONS:  None.  DISPOSITION:  Stable.  ESTIMATED BLOOD LOSS:  Minimal.  INDICATIONS FOR PROCEDURE:  Briefly, Jacob Walters is a very pleasant 58 year old male, who did present to me with ongoing pain in the left leg.  An MRI did reveal a left L5-S1 disk herniation.  The patient's symptoms were consistent with left S1 radiculopathy.  The patient failed conservative care, we did have a discussion regarding proceeding with the procedure above.  OPERATIVE DETAILS:  On October 22, 2013, the patient was brought to surgery and general endotracheal anesthesia was administered.  The patient was placed prone on a well-padded flat Jackson bed.  All bony prominences were meticulously padded.  A time-out procedure was performed.  The back was prepped and draped in the usual fashion.  A midline incision was made overlying the L5-S1 interspace.  A curvilinear fasciotomy was made to the left of the midline.  The lamina of L5 and S1 were subperiosteally exposed.  A self-retaining McCullough retractor was placed.  I then performed a partial facetectomy on the left.  The lateral aspect of the ligamentum flavum was removed.  The traversing S1 nerve was identified and medially  retracted.  A very large pedunculated and prominent L5-S1 disk herniation was identified removed uneventfully. The S1 nerve did return to its normal position.  There was no tension on the nerve.  Bipolar electrocautery was used to coagulate some minor areas of epidural bleeding.  A 40 mg of Depo-Medrol was then introduced about the epidural space.  The fascia was then closed using #1 Vicryl. The skin was then closed using 2-0 Vicryl followed by 3-0 Monocryl.  All instrument counts were correct at the termination of the procedure.  Of note, Jason CoopKayla McKenzie was my assistant throughout surgery, and did aid in retraction suctioning and closure.     Estill BambergMark Zhane Donlan, MD     MD/MEDQ  D:  10/22/2013  T:  10/23/2013  Job:  [id number]  cc:   Vikki PortsValerie A. Felicity CoyerLeschber, MD

## 2013-10-24 ENCOUNTER — Encounter (HOSPITAL_COMMUNITY): Payer: Self-pay | Admitting: Orthopedic Surgery

## 2013-11-07 ENCOUNTER — Ambulatory Visit (HOSPITAL_COMMUNITY)
Admission: RE | Admit: 2013-11-07 | Discharge: 2013-11-07 | Disposition: A | Discharge status: Home / Self Care | Payer: BC Managed Care – PPO | Source: Ambulatory Visit | Attending: Cardiovascular Disease | Admitting: Cardiovascular Disease

## 2013-11-07 DIAGNOSIS — I519 Heart disease, unspecified: Secondary | ICD-10-CM | POA: Insufficient documentation

## 2013-11-07 DIAGNOSIS — I447 Left bundle-branch block, unspecified: Secondary | ICD-10-CM | POA: Insufficient documentation

## 2013-11-07 NOTE — Progress Notes (Signed)
2D Echocardiogram Complete.  11/07/2013   Zlata Alcaide, RDCS  

## 2013-11-12 ENCOUNTER — Ambulatory Visit: Payer: BC Managed Care – PPO | Admitting: Cardiology

## 2013-11-13 ENCOUNTER — Telehealth: Payer: Self-pay | Admitting: *Deleted

## 2013-11-13 NOTE — Telephone Encounter (Signed)
Message copied by Tobin ChadMARTIN, SHARON V. on Thu Nov 13, 2013  9:26 AM ------      Message from: Thurmon FairROITORU, MIHAI      Created: Mon Nov 10, 2013  9:38 AM       Normal echo ------

## 2013-11-13 NOTE — Telephone Encounter (Signed)
Spoke to patient. Result given . Verbalized understanding  

## 2014-01-21 ENCOUNTER — Other Ambulatory Visit (INDEPENDENT_AMBULATORY_CARE_PROVIDER_SITE_OTHER): Payer: BC Managed Care – PPO

## 2014-01-21 ENCOUNTER — Other Ambulatory Visit: Payer: BC Managed Care – PPO

## 2014-01-21 ENCOUNTER — Ambulatory Visit (INDEPENDENT_AMBULATORY_CARE_PROVIDER_SITE_OTHER): Payer: BC Managed Care – PPO | Admitting: Internal Medicine

## 2014-01-21 ENCOUNTER — Encounter: Payer: Self-pay | Admitting: Internal Medicine

## 2014-01-21 VITALS — BP 132/78 | HR 59 | Temp 97.9°F | Ht 70.0 in | Wt 190.0 lb

## 2014-01-21 DIAGNOSIS — G4730 Sleep apnea, unspecified: Secondary | ICD-10-CM

## 2014-01-21 DIAGNOSIS — Z0000 Encounter for general adult medical examination without abnormal findings: Secondary | ICD-10-CM

## 2014-01-21 DIAGNOSIS — I10 Essential (primary) hypertension: Secondary | ICD-10-CM

## 2014-01-21 LAB — URINALYSIS, ROUTINE W REFLEX MICROSCOPIC
Bilirubin Urine: NEGATIVE
Hgb urine dipstick: NEGATIVE
Ketones, ur: NEGATIVE
LEUKOCYTES UA: NEGATIVE
NITRITE: NEGATIVE
RBC / HPF: NONE SEEN (ref 0–?)
Specific Gravity, Urine: >=1.03 — AB (ref 1.000–1.030)
Total Protein, Urine: NEGATIVE
UROBILINOGEN UA: 0.2 (ref 0.0–1.0)
Urine Glucose: NEGATIVE
pH: 5.5 (ref 5.0–8.0)

## 2014-01-21 LAB — LIPID PANEL
Cholesterol: 133 mg/dL (ref 0–200)
HDL: 43.7 mg/dL (ref >39.00)
LDL Cholesterol: 79 mg/dL (ref 0–99)
NONHDL: 89.3
Total CHOL/HDL Ratio: 3
Triglycerides: 53 mg/dL (ref 0.0–149.0)
VLDL: 10.6 mg/dL (ref 0.0–40.0)

## 2014-01-21 LAB — CBC WITH DIFFERENTIAL/PLATELET
BASOS PCT: 0.5 % (ref 0.0–3.0)
Basophils Absolute: 0 10*3/uL (ref 0.0–0.1)
Eosinophils Absolute: 0.2 10*3/uL (ref 0.0–0.7)
Eosinophils Relative: 2.3 % (ref 0.0–5.0)
HCT: 43.5 % (ref 39.0–52.0)
HEMOGLOBIN: 14.2 g/dL (ref 13.0–17.0)
Lymphocytes Relative: 25.9 % (ref 12.0–46.0)
Lymphs Abs: 1.9 10*3/uL (ref 0.7–4.0)
MCHC: 32.6 g/dL (ref 30.0–36.0)
MCV: 89.4 fl (ref 78.0–100.0)
MONOS PCT: 5 % (ref 3.0–12.0)
Monocytes Absolute: 0.4 10*3/uL (ref 0.1–1.0)
Neutro Abs: 5 10*3/uL (ref 1.4–7.7)
Neutrophils Relative %: 66.3 % (ref 43.0–77.0)
Platelets: 305 10*3/uL (ref 150.0–400.0)
RBC: 4.87 Mil/uL (ref 4.22–5.81)
RDW: 12.4 % (ref 11.5–15.5)
WBC: 7.5 10*3/uL (ref 4.0–10.5)

## 2014-01-21 LAB — HEPATIC FUNCTION PANEL
ALBUMIN: 3.7 g/dL (ref 3.5–5.2)
ALK PHOS: 100 U/L (ref 39–117)
ALT: 21 U/L (ref 0–53)
AST: 18 U/L (ref 0–37)
Bilirubin, Direct: 0.2 mg/dL (ref 0.0–0.3)
TOTAL PROTEIN: 7 g/dL (ref 6.0–8.3)
Total Bilirubin: 0.7 mg/dL (ref 0.2–1.2)

## 2014-01-21 LAB — BASIC METABOLIC PANEL
BUN: 15 mg/dL (ref 6–23)
CALCIUM: 9 mg/dL (ref 8.4–10.5)
CO2: 25 meq/L (ref 19–32)
Chloride: 105 mEq/L (ref 96–112)
Creatinine, Ser: 0.9 mg/dL (ref 0.4–1.5)
GFR: 89 mL/min (ref >60.00)
Glucose, Bld: 99 mg/dL (ref 70–99)
POTASSIUM: 4.4 meq/L (ref 3.5–5.1)
SODIUM: 140 meq/L (ref 135–145)

## 2014-01-21 LAB — TSH: TSH: 1.24 u[IU]/mL (ref 0.35–4.50)

## 2014-01-21 LAB — PSA: PSA: 1.33 ng/mL (ref 0.10–4.00)

## 2014-01-21 NOTE — Assessment & Plan Note (Signed)
STOP BANG 5 preop 10/2013 Never pursued sleep study to confirm OSA, but reports suspect hx of same in past Will refer for further eval when pt ready Discussed potential relationship between untreated sleep apnea and hypertension

## 2014-01-21 NOTE — Progress Notes (Signed)
Subjective:    Patient ID: Jacob Walters, male    DOB: [birth year], 58 y.o.   MRN: [id number]  HPI  New patient to me- here to establish with new PCP - prev with [facility] in [place]  patient is here today for annual physical. Patient feels well and has no complaints.  Also reviewed chronic medical issues and interval medical events  Past Medical History  Diagnosis Date  . History of kidney stones 2006  . Hx of colonic polyps 2012  . Hypertension   . H/O tobacco use, presenting hazards to health   . LBBB (left bundle branch block)   . LV dysfunction     a. Nuc 10/2013: no ischemia, EF 38% with paradoxical septal motion. Hypokinesis of the basal and mid inferoseptal and inferior walls. Difficult study. Unclear if WMA due to LBBB. Echo to follow as outpatient.  Marland Kitchen HNP (herniated nucleus pulposus), lumbar 10/2013    Left L5   Family History  Problem Relation Age of Onset  . Breast cancer Maternal Grandmother   . Alcohol abuse Father   . Diabetes Maternal Grandmother   . Heart Problems Mother 38    valve replacement  . Hyperlipidemia Father   . Hypertension Father    History  Substance Use Topics  . Smoking status: Former Smoker -- 25 years    Quit date: 10/20/2012  . Smokeless tobacco: Never Used  . Alcohol Use: No     Comment: quit 07/2012- had been drinking 6-8 beers /night    Review of Systems  Constitutional: Negative for fever, activity change, appetite change, fatigue and unexpected weight change.  Respiratory: Negative for cough, chest tightness, shortness of breath and wheezing.   Cardiovascular: Negative for chest pain, palpitations and leg swelling.  Neurological: Negative for dizziness, weakness and headaches.  Psychiatric/Behavioral: Negative for dysphoric mood. The patient is not nervous/anxious.   All other systems reviewed and are negative.      Objective:   Physical Exam  BP 132/78  Pulse 59  Temp(Src) 97.9 F (36.6 C) (Oral)  Ht 5\' 10"  (1.778 m)   Wt 190 lb (86.183 kg)  BMI 27.26 kg/m2  SpO2 97% Wt Readings from Last 3 Encounters:  01/21/14 190 lb (86.183 kg)  10/22/13 182 lb (82.555 kg)  10/22/13 182 lb (82.555 kg)   Constitutional: he appears well-developed and well-nourished. No distress.  HENT: Head: Normocephalic and atraumatic. Ears: B TMs ok, no erythema or effusion; Nose: Nose normal. Mouth/Throat: Oropharynx is clear and moist. No oropharyngeal exudate.  Eyes: Conjunctivae and EOM are normal. Pupils are equal, round, and reactive to light. No scleral icterus.  Neck: Normal range of motion. Neck supple. No JVD present. No thyromegaly present.  Cardiovascular: Normal rate, regular rhythm and normal heart sounds.  No murmur heard. No BLE edema. Pulmonary/Chest: Effort normal and breath sounds normal. No respiratory distress. he has no wheezes.  Abdominal: Soft. Bowel sounds are normal. he exhibits no distension. There is no tenderness. no masses GU: defer Musculoskeletal: Normal range of motion, no joint effusions. No gross deformities Neurological: he is alert and oriented to person, place, and time. No cranial nerve deficit. Coordination, balance, strength, speech and gait are normal.  Skin: Skin is warm and dry. No rash noted. No erythema.  Psychiatric: he has a normal mood and affect. behavior is normal. Judgment and thought content normal.  Lab Results  Component Value Date   WBC 8.4 10/21/2013   HGB 13.1 10/21/2013   HCT 38.8*  10/21/2013   PLT 207 10/21/2013   GLUCOSE 109* 10/21/2013   CHOL 108 10/21/2013   TRIG 64 10/21/2013   HDL 49 10/21/2013   LDLCALC 46 10/21/2013   ALT 28 10/20/2013   AST 15 10/20/2013   NA 141 10/21/2013   K 3.5* 10/21/2013   CL 103 10/21/2013   CREATININE 0.72 10/21/2013   CREATININE 0.73 10/21/2013   BUN 16 10/21/2013   CO2 25 10/21/2013   TSH 1.570 10/20/2013   INR 0.98 10/20/2013    No results found.     Assessment & Plan:   CPX - Patient has been counseled on age-appropriate routine health concerns for  screening and prevention. These are reviewed and up-to-date. Immunizations are up-to-date or declined. Labs ordered and prior ECG reviewed.  Problem List Items Addressed This Visit   HTN (hypertension)      BP Readings from Last 3 Encounters:  01/21/14 132/78  10/22/13 138/69  10/22/13 138/69   The current medical regimen is effective;  continue present plan and medications.     Sleep apnea     STOP BANG 5 preop 10/2013 Never pursued sleep study to confirm OSA, but reports suspect hx of same in past Will refer for further eval when pt ready Discussed potential relationship between untreated sleep apnea and hypertension      Other Visit Diagnoses   Health maintenance examination    -  Primary    Relevant Orders       Basic metabolic panel       CBC with Differential       Hepatic function panel       Lipid panel       TSH       Urinalysis, Routine w reflex microscopic       PSA

## 2014-01-21 NOTE — Assessment & Plan Note (Signed)
BP Readings from Last 3 Encounters:  01/21/14 132/78  10/22/13 138/69  10/22/13 138/69   The current medical regimen is effective;  continue present plan and medications.

## 2014-01-21 NOTE — Progress Notes (Signed)
Pre visit review using our clinic review tool, if applicable. No additional management support is needed unless otherwise documented below in the visit note. 

## 2014-01-21 NOTE — Patient Instructions (Addendum)
It was good to see you today.  We have reviewed your prior records including labs and tests today  Health Maintenance reviewed - consider tetanus and flu immunizations as discussed -all recommended immunizations and age-appropriate screenings are up-to-date.  Test(s) ordered today. Your results will be released to MyChart (or called to you) after review, usually within 72hours after test completion. If any changes need to be made, you will be notified at that same time.  Medications reviewed and updated, no changes recommended at this time.  Please schedule followup in 12 months for annual exam and labs, call sooner if problems.  Health Maintenance A healthy lifestyle and preventative care can promote health and wellness.  Maintain regular health, dental, and eye exams.  Eat a healthy diet. Foods like vegetables, fruits, whole grains, low-fat dairy products, and lean protein foods contain the nutrients you need and are low in calories. Decrease your intake of foods high in solid fats, added sugars, and salt. Get information about a proper diet from your health care provider, if necessary.  Regular physical exercise is one of the most important things you can do for your health. Most adults should get at least 150 minutes of moderate-intensity exercise (any activity that increases your heart rate and causes you to sweat) each week. In addition, most adults need muscle-strengthening exercises on 2 or more days a week.   Maintain a healthy weight. The body mass index (BMI) is a screening tool to identify possible weight problems. It provides an estimate of body fat based on height and weight. Your health care provider can find your BMI and can help you achieve or maintain a healthy weight. For males 20 years and older:  A BMI below 18.5 is considered underweight.  A BMI of 18.5 to 24.9 is normal.  A BMI of 25 to 29.9 is considered overweight.  A BMI of 30 and above is considered  obese.  Maintain normal blood lipids and cholesterol by exercising and minimizing your intake of saturated fat. Eat a balanced diet with plenty of fruits and vegetables. Blood tests for lipids and cholesterol should begin at age 3 and be repeated every 5 years. If your lipid or cholesterol levels are high, you are over age 8, or you are at high risk for heart disease, you Krawiec need your cholesterol levels checked more frequently.Ongoing high lipid and cholesterol levels should be treated with medicines if diet and exercise are not working.  If you smoke, find out from your health care provider how to quit. If you do not use tobacco, do not start.  Lung cancer screening is recommended for adults aged 55-80 years who are at high risk for developing lung cancer because of a history of smoking. A yearly low-dose CT scan of the lungs is recommended for people who have at least a 30-pack-year history of smoking and are current smokers or have quit within the past 15 years. A pack year of smoking is smoking an average of 1 pack of cigarettes a day for 1 year (for example, a 30-pack-year history of smoking could mean smoking 1 pack a day for 30 years or 2 packs a day for 15 years). Yearly screening should continue until the smoker has stopped smoking for at least 15 years. Yearly screening should be stopped for people who develop a health problem that would prevent them from having lung cancer treatment.  If you choose to drink alcohol, do not have more than 2 drinks per day. One  drink is considered to be 12 oz (360 mL) of beer, 5 oz (150 mL) of wine, or 1.5 oz (45 mL) of liquor.  Avoid the use of street drugs. Do not share needles with anyone. Ask for help if you need support or instructions about stopping the use of drugs.  High blood pressure causes heart disease and increases the risk of stroke. Blood pressure should be checked at least every 1-2 years. Ongoing high blood pressure should be treated with  medicines if weight loss and exercise are not effective.  If you are 32-92 years old, ask your health care provider if you should take aspirin to prevent heart disease.  Diabetes screening involves taking a blood sample to check your fasting blood sugar level. This should be done once every 3 years after age 9 if you are at a normal weight and without risk factors for diabetes. Testing should be considered at a younger age or be carried out more frequently if you are overweight and have at least 1 risk factor for diabetes.  Colorectal cancer can be detected and often prevented. Most routine colorectal cancer screening begins at the age of 67 and continues through age 43. However, your health care provider Dizdarevic recommend screening at an earlier age if you have risk factors for colon cancer. On a yearly basis, your health care provider Bruss provide home test kits to check for hidden blood in the stool. A small camera at the end of a tube Swann be used to directly examine the colon (sigmoidoscopy or colonoscopy) to detect the earliest forms of colorectal cancer. Talk to your health care provider about this at age 33 when routine screening begins. A direct exam of the colon should be repeated every 5-10 years through age 11, unless early forms of precancerous polyps or small growths are found.  People who are at an increased risk for hepatitis B should be screened for this virus. You are considered at high risk for hepatitis B if:  You were born in a country where hepatitis B occurs often. Talk with your health care provider about which countries are considered high risk.  Your parents were born in a high-risk country and you have not received a shot to protect against hepatitis B (hepatitis B vaccine).  You have HIV or AIDS.  You use needles to inject street drugs.  You live with, or have sex with, someone who has hepatitis B.  You are a man who has sex with other men (MSM).  You get hemodialysis  treatment.  You take certain medicines for conditions like cancer, organ transplantation, and autoimmune conditions.  Hepatitis C blood testing is recommended for all people born from [birth year] through 1964 and any individual with known risk factors for hepatitis C.  Healthy men should no longer receive prostate-specific antigen (PSA) blood tests as part of routine cancer screening. Talk to your health care provider about prostate cancer screening.  Testicular cancer screening is not recommended for adolescents or adult males who have no symptoms. Screening includes self-exam, a health care provider exam, and other screening tests. Consult with your health care provider about any symptoms you have or any concerns you have about testicular cancer.  Practice safe sex. Use condoms and avoid high-risk sexual practices to reduce the spread of sexually transmitted infections (STIs).  You should be screened for STIs, including gonorrhea and chlamydia if:  You are sexually active and are younger than 24 years.  You are older than 24 years,  and your health care provider tells you that you are at risk for this type of infection.  Your sexual activity has changed since you were last screened, and you are at an increased risk for chlamydia or gonorrhea. Ask your health care provider if you are at risk.  If you are at risk of being infected with HIV, it is recommended that you take a prescription medicine daily to prevent HIV infection. This is called pre-exposure prophylaxis (PrEP). You are considered at risk if:  You are a man who has sex with other men (MSM).  You are a heterosexual man who is sexually active with multiple partners.  You take drugs by injection.  You are sexually active with a partner who has HIV.  Talk with your health care provider about whether you are at high risk of being infected with HIV. If you choose to begin PrEP, you should first be tested for HIV. You should then be tested  every 3 months for as long as you are taking PrEP.  Use sunscreen. Apply sunscreen liberally and repeatedly throughout the day. You should seek shade when your shadow is shorter than you. Protect yourself by wearing long sleeves, pants, a wide-brimmed hat, and sunglasses year round whenever you are outdoors.  Tell your health care provider of new moles or changes in moles, especially if there is a change in shape or color. Also, tell your health care provider if a mole is larger than the size of a pencil eraser.  A one-time screening for abdominal aortic aneurysm (AAA) and surgical repair of large AAAs by ultrasound is recommended for men aged 65-75 years who are current or former smokers.  Stay current with your vaccines (immunizations). Document Released: 09/30/2007 Document Revised: 04/08/2013 Document Reviewed: 08/29/2010 [facility] Patient Information 2015 [place], [place]. This information is not intended to replace advice given to you by your health care provider. Make sure you discuss any questions you have with your health care provider.

## 2014-01-22 ENCOUNTER — Telehealth: Payer: Self-pay | Admitting: Internal Medicine

## 2014-01-22 ENCOUNTER — Telehealth: Payer: Self-pay

## 2014-01-22 NOTE — Telephone Encounter (Signed)
Sent form to [employer] (pt employer). The form is the [employer] form with recent vitals and labs.   Copy sent to scan.   Mailed original to pt.   Pt informed.

## 2014-01-22 NOTE — Telephone Encounter (Signed)
emmi emailed °

## 2014-10-09 ENCOUNTER — Other Ambulatory Visit: Payer: Self-pay | Admitting: Physician Assistant

## 2014-10-09 NOTE — Telephone Encounter (Signed)
Rx(s) sent to pharmacy electronically.  

## 2014-11-05 ENCOUNTER — Other Ambulatory Visit: Payer: Self-pay | Admitting: Cardiology

## 2014-11-05 ENCOUNTER — Other Ambulatory Visit: Payer: Self-pay | Admitting: Physician Assistant

## 2014-11-05 NOTE — Telephone Encounter (Signed)
REFILL 

## 2015-01-01 ENCOUNTER — Other Ambulatory Visit: Payer: Self-pay | Admitting: Cardiology

## 2015-01-01 NOTE — Telephone Encounter (Signed)
REFILL 

## 2015-01-04 ENCOUNTER — Other Ambulatory Visit: Payer: Self-pay | Admitting: Cardiology

## 2015-01-06 ENCOUNTER — Other Ambulatory Visit: Payer: Self-pay | Admitting: Cardiology

## 2015-01-06 NOTE — Telephone Encounter (Signed)
REFILL 

## 2015-01-25 ENCOUNTER — Other Ambulatory Visit (INDEPENDENT_AMBULATORY_CARE_PROVIDER_SITE_OTHER): Payer: BLUE CROSS/BLUE SHIELD

## 2015-01-25 ENCOUNTER — Ambulatory Visit: Payer: BC Managed Care – PPO | Admitting: Internal Medicine

## 2015-01-25 ENCOUNTER — Telehealth: Payer: Self-pay | Admitting: Internal Medicine

## 2015-01-25 ENCOUNTER — Ambulatory Visit (INDEPENDENT_AMBULATORY_CARE_PROVIDER_SITE_OTHER): Payer: BLUE CROSS/BLUE SHIELD | Admitting: Internal Medicine

## 2015-01-25 ENCOUNTER — Encounter: Payer: Self-pay | Admitting: Internal Medicine

## 2015-01-25 VITALS — BP 136/84 | HR 67 | Temp 97.7°F | Resp 16 | Ht 70.0 in | Wt 196.0 lb

## 2015-01-25 DIAGNOSIS — Z0000 Encounter for general adult medical examination without abnormal findings: Secondary | ICD-10-CM | POA: Diagnosis not present

## 2015-01-25 DIAGNOSIS — G4730 Sleep apnea, unspecified: Secondary | ICD-10-CM

## 2015-01-25 DIAGNOSIS — I447 Left bundle-branch block, unspecified: Secondary | ICD-10-CM

## 2015-01-25 DIAGNOSIS — M5410 Radiculopathy, site unspecified: Secondary | ICD-10-CM

## 2015-01-25 LAB — COMPREHENSIVE METABOLIC PANEL
ALBUMIN: 4.3 g/dL (ref 3.5–5.2)
ALT: 27 U/L (ref 0–53)
AST: 18 U/L (ref 0–37)
Alkaline Phosphatase: 102 U/L (ref 39–117)
BILIRUBIN TOTAL: 0.5 mg/dL (ref 0.2–1.2)
BUN: 16 mg/dL (ref 6–23)
CALCIUM: 9.2 mg/dL (ref 8.4–10.5)
CO2: 28 meq/L (ref 19–32)
CREATININE: 0.91 mg/dL (ref 0.40–1.50)
Chloride: 102 mEq/L (ref 96–112)
GFR: 90.93 mL/min (ref >60.00)
Glucose, Bld: 105 mg/dL — ABNORMAL HIGH (ref 70–99)
Potassium: 3.9 mEq/L (ref 3.5–5.1)
SODIUM: 138 meq/L (ref 135–145)
Total Protein: 7.2 g/dL (ref 6.0–8.3)

## 2015-01-25 LAB — LIPID PANEL
Cholesterol: 138 mg/dL (ref 0–200)
HDL: 56.6 mg/dL (ref >39.00)
LDL Cholesterol: 67 mg/dL (ref 0–99)
NonHDL: 81.58
Total CHOL/HDL Ratio: 2
Triglycerides: 72 mg/dL (ref 0.0–149.0)
VLDL: 14.4 mg/dL (ref 0.0–40.0)

## 2015-01-25 LAB — CBC WITH DIFFERENTIAL/PLATELET
BASOS ABS: 0 10*3/uL (ref 0.0–0.1)
BASOS PCT: 0.6 % (ref 0.0–3.0)
EOS ABS: 0.1 10*3/uL (ref 0.0–0.7)
Eosinophils Relative: 2.1 % (ref 0.0–5.0)
HCT: 45.1 % (ref 39.0–52.0)
HEMOGLOBIN: 15.2 g/dL (ref 13.0–17.0)
Lymphocytes Relative: 24.8 % (ref 12.0–46.0)
Lymphs Abs: 1.7 10*3/uL (ref 0.7–4.0)
MCHC: 33.7 g/dL (ref 30.0–36.0)
MCV: 85.4 fl (ref 78.0–100.0)
MONO ABS: 0.3 10*3/uL (ref 0.1–1.0)
Monocytes Relative: 4.4 % (ref 3.0–12.0)
Neutro Abs: 4.5 10*3/uL (ref 1.4–7.7)
Neutrophils Relative %: 68.1 % (ref 43.0–77.0)
PLATELETS: 257 10*3/uL (ref 150.0–400.0)
RBC: 5.28 Mil/uL (ref 4.22–5.81)
RDW: 13.8 % (ref 11.5–15.5)
WBC: 6.7 10*3/uL (ref 4.0–10.5)

## 2015-01-25 LAB — TSH: TSH: 1.45 u[IU]/mL (ref 0.35–4.50)

## 2015-01-25 MED ORDER — LISINOPRIL 5 MG PO TABS: 5.0000 mg | ORAL_TABLET | Freq: Every day | ORAL | Status: AC

## 2015-01-25 NOTE — Patient Instructions (Addendum)
We have reviewed your prior records including labs and tests today.  Test(s) ordered today. Your results will be released to MyChart (or called to you) after review, usually within 72hours after test completion. If any changes need to be made, you will be notified at that same time.  All other Health Maintenance issues reviewed.   All recommended immunizations and age-appropriate screenings are up-to-date.  No immunizations administered today.   Medications reviewed and updated.  Changes include discontinuing the atenolol.  Continue the lisinopril 5 mg and take daily.  Start monitoring your blood pressure.   The goal for your blood pressure is less than 140/90.   Your prescription(s) have been submitted to your pharmacy. Please take as directed and contact our office if you believe you are having problem(s) with the medication(s).   Please schedule followup in one year for a physical    Health Maintenance, Male A healthy lifestyle and preventative care can promote health and wellness.  Maintain regular health, dental, and eye exams.  Eat a healthy diet. Foods like vegetables, fruits, whole grains, low-fat dairy products, and lean protein foods contain the nutrients you need and are low in calories. Decrease your intake of foods high in solid fats, added sugars, and salt. Get information about a proper diet from your health care provider, if necessary.  Regular physical exercise is one of the most important things you can do for your health. Most adults should get at least 150 minutes of moderate-intensity exercise (any activity that increases your heart rate and causes you to sweat) each week. In addition, most adults need muscle-strengthening exercises on 2 or more days a week.   Maintain a healthy weight. The body mass index (BMI) is a screening tool to identify possible weight problems. It provides an estimate of body fat based on height and weight. Your health care provider can find  your BMI and can help you achieve or maintain a healthy weight. For males 20 years and older:  A BMI below 18.5 is considered underweight.  A BMI of 18.5 to 24.9 is normal.  A BMI of 25 to 29.9 is considered overweight.  A BMI of 30 and above is considered obese.  Maintain normal blood lipids and cholesterol by exercising and minimizing your intake of saturated fat. Eat a balanced diet with plenty of fruits and vegetables. Blood tests for lipids and cholesterol should begin at age 80 and be repeated every 5 years. If your lipid or cholesterol levels are high, you are over age 49, or you are at high risk for heart disease, you Sp[place]el need your cholesterol levels checked more frequently.Ongoing high lipid and cholesterol levels should be treated with medicines if diet and exercise are not working.  If you smoke, find out from your health care provider how to quit. If you do not use tobacco, do not start.  Lung cancer screening is recommended for adults aged 55-80 years who are at high risk for developing lung cancer because of a history of smoking. A yearly low-dose CT scan of the lungs is recommended for people who have at least a 30-pack-year history of smoking and are current smokers or have quit within the past 15 years. A pack year of smoking is smoking an average of 1 pack of cigarettes a day for 1 year (for example, a 30-pack-year history of smoking could mean smoking 1 pack a day for 30 years or 2 packs a day for 15 years). Yearly screening should continue until  the smoker has stopped smoking for at least 15 years. Yearly screening should be stopped for people who develop a health problem that would prevent them from having lung cancer treatment.  If you choose to drink alcohol, do not have more than 2 drinks per day. One drink is considered to be 12 oz (360 mL) of beer, 5 oz (150 mL) of wine, or 1.5 oz (45 mL) of liquor.  Avoid the use of street drugs. Do not share needles with anyone. Ask for  help if you need support or instructions about stopping the use of drugs.  High blood pressure causes heart disease and increases the risk of stroke. High blood pressure is more likely to develop in:  People who have blood pressure in the end of the normal range (100-139/85-89 mm Hg).  People who are overweight or obese.  People who are African American.  If you are 85-54 years of age, have your blood pressure checked every 3-5 years. If you are 56 years of age or older, have your blood pressure checked every year. You should have your blood pressure measured twice--once when you are at a hospital or clinic, and once when you are not at a hospital or clinic. Record the average of the two measurements. To check your blood pressure when you are not at a hospital or clinic, you can use:  An automated blood pressure machine at a pharmacy.  A home blood pressure monitor.  If you are 92-36 years old, ask your health care provider if you should take aspirin to prevent heart disease.  Diabetes screening involves taking a blood sample to check your fasting blood sugar level. This should be done once every 3 years after age 55 if you are at a normal weight and without risk factors for diabetes. Testing should be considered at a younger age or be carried out more frequently if you are overweight and have at least 1 risk factor for diabetes.  Colorectal cancer can be detected and often prevented. Most routine colorectal cancer screening begins at the age of 39 and continues through age 31. However, your health care provider Homer recommend screening at an earlier age if you have risk factors for colon cancer. On a yearly basis, your health care provider St[place] provide home test kits to check for hidden blood in the stool. A small camera at the end of a tube Schweiger be used to directly examine the colon (sigmoidoscopy or colonoscopy) to detect the earliest forms of colorectal cancer. Talk to your health care provider  about this at age 33 when routine screening begins. A direct exam of the colon should be repeated every 5-10 years through age 49, unless early forms of precancerous polyps or small growths are found.  People who are at an increased risk for hepatitis B should be screened for this virus. You are considered at high risk for hepatitis B if:  You were born in a country where hepatitis B occurs often. Talk with your health care provider about which countries are considered high risk.  Your parents were born in a high-risk country and you have not received a shot to protect against hepatitis B (hepatitis B vaccine).  You have HIV or AIDS.  You use needles to inject street drugs.  You live with, or have sex with, someone who has hepatitis B.  You are a man who has sex with other men (MSM).  You get hemodialysis treatment.  You take certain medicines for conditions  like cancer, organ transplantation, and autoimmune conditions.  Hepatitis C blood testing is recommended for all people born from [birth year] through 1964 and any individual with known risk factors for hepatitis C.  Healthy men should no longer receive prostate-specific antigen (PSA) blood tests as part of routine cancer screening. Talk to your health care provider about prostate cancer screening.  Testicular cancer screening is not recommended for adolescents or adult males who have no symptoms. Screening includes self-exam, a health care provider exam, and other screening tests. Consult with your health care provider about any symptoms you have or any concerns you have about testicular cancer.  Practice safe sex. Use condoms and avoid high-risk sexual practices to reduce the spread of sexually transmitted infections (STIs).  You should be screened for STIs, including gonorrhea and chlamydia if:  You are sexually active and are younger than 24 years.  You are older than 24 years, and your health care provider tells you that you are at  risk for this type of infection.  Your sexual activity has changed since you were last screened, and you are at an increased risk for chlamydia or gonorrhea. Ask your health care provider if you are at risk.  If you are at risk of being infected with HIV, it is recommended that you take a prescription medicine daily to prevent HIV infection. This is called pre-exposure prophylaxis (PrEP). You are considered at risk if:  You are a man who has sex with other men (MSM).  You are a heterosexual man who is sexually active with multiple partners.  You take drugs by injection.  You are sexually active with a partner who has HIV.  Talk with your health care provider about whether you are at high risk of being infected with HIV. If you choose to begin PrEP, you should first be tested for HIV. You should then be tested every 3 months for as long as you are taking PrEP.  Use sunscreen. Apply sunscreen liberally and repeatedly throughout the day. You should seek shade when your shadow is shorter than you. Protect yourself by wearing long sleeves, pants, a wide-brimmed hat, and sunglasses year round whenever you are outdoors.  Tell your health care provider of new moles or changes in moles, especially if there is a change in shape or color. Also, tell your health care provider if a mole is larger than the size of a pencil eraser.  A one-time screening for abdominal aortic aneurysm (AAA) and surgical repair of large AAAs by ultrasound is recommended for men aged 65-75 years who are current or former smokers.  Stay current with your vaccines (immunizations).   This information is not intended to replace advice given to you by your health care provider. Make sure you discuss any questions you have with your health care provider.   Document Released: 09/30/2007 Document Revised: 04/24/2014 Document Reviewed: 08/29/2010 Elsevier Interactive Patient Education [employer]

## 2015-01-25 NOTE — Progress Notes (Signed)
Subjective:    Patient ID: Jacob Walters, male    DOB: [birth year], 59 y.o.   MRN: [id number]  HPI He is here for a physical exam.  He denies any major changes in his health since he was here last.   ? Enlarged prostate:  He thinks he Falkenstein have an enlarged prostate.  He has more frequent urination during the day and at night.  He denies any difficulty initiating urination, dysuria and hematuria. He is not interested in medication.   Hypertension: He is not taking his medication daily. He has not taken the atenolol in one month and takes the lisinopril some days.  He last took the lisinopril about three days ago.  He is compliant with a low sodium diet.  He denies chest pain, palpitations, edema, shortness of breath and regular headaches. He is exercising regularly, but it is minimal exercise.  He does not monitor his blood pressure at home.    He does not have any other concerns.  Medications and allergies reviewed with patient and updated if appropriate.  Patient Active Problem List   Diagnosis Date Noted  . Sleep apnea   . LBBB (left bundle branch block) 10/21/2013  . LV dysfunction 10/21/2013  . HTN (hypertension) 10/20/2013  . Back pain with left-sided radiculopathy 08/26/2013    Past Medical History  Diagnosis Date  . History of kidney stones 2006  . Hx of colonic polyps 2012  . Hypertension   . H/O tobacco use, presenting hazards to health   . LBBB (left bundle branch block)   . LV dysfunction     a. Nuc 10/2013: no ischemia, EF 38% with paradoxical septal motion. Hypokinesis of the basal and mid inferoseptal and inferior walls. Difficult study. Unclear if WMA due to LBBB. Echo to follow as outpatient.  Marland Kitchen HNP (herniated nucleus pulposus), lumbar 10/2013    Left L5    Past Surgical History  Procedure Laterality Date  . Colonoscopy    . Lumbar laminectomy/decompression microdiscectomy Left 10/22/2013    Procedure: LUMBAR LAMINECTOMY/DECOMPRESSION MICRODISCECTOMY 1 LEVEL;   Surgeon: Emilee Hero, MD;  Location: [facility] OR;  Service: Orthopedics;  Laterality: Left;  Left sided lumbar 5-sacrum 1 microdisectomy    Social History   Social History  . Marital Status: Married    Spouse Name: N/A  . Number of Children: N/A  . Years of Education: N/A   Social History Main Topics  . Smoking status: Former Smoker -- 25 years    Quit date: 10/20/2012  . Smokeless tobacco: Never Used  . Alcohol Use: No     Comment: quit 07/2012- had been drinking 6-8 beers /night  . Drug Use: No  . Sexual Activity: Not Asked   Other Topics Concern  . None   Social History Narrative   Exercise: walking the dog, yardwork    Review of Systems  Constitutional: Negative for fever, chills, appetite change, fatigue and unexpected weight change.  HENT: Negative for hearing loss.   Eyes: Negative for visual disturbance.  Respiratory: Negative for cough, shortness of breath and wheezing.   Cardiovascular: Negative.   Gastrointestinal: Negative for nausea, abdominal pain, diarrhea, constipation and blood in stool.  Genitourinary: Negative for dysuria and hematuria.  Musculoskeletal: Positive for arthralgias (knee pain -arthritis). Negative for myalgias and back pain.  Skin: Negative for rash.  Neurological: Negative for dizziness, light-headedness and headaches.  Psychiatric/Behavioral: Negative for dysphoric mood. The patient is not nervous/anxious.  Objective:   Filed Vitals:   01/25/15 0912  BP: 136/84  Pulse: 67  Temp: 97.7 F (36.5 C)  Resp: 16   Filed Weights   01/25/15 0912  Weight: 196 lb (88.905 kg)   Body mass index is 28.12 kg/(m^2).   Physical Exam  Constitutional: He is oriented to person, place, and time. He appears well-developed and well-nourished. No distress.  HENT:  Head: Normocephalic and atraumatic.  Right Ear: External ear normal.  Left Ear: External ear normal.  Nose: Nose normal.  Mouth/Throat: Oropharynx is clear and moist.    Eyes: Conjunctivae are normal.  Neck: Normal range of motion. Neck supple. No tracheal deviation present. No thyromegaly present.  No carotid bruit  Cardiovascular: Normal rate, regular rhythm and normal heart sounds.   No murmur heard. Pulmonary/Chest: Effort normal and breath sounds normal. No respiratory distress. He has no wheezes.  Abdominal: Soft. Bowel sounds are normal. He exhibits no distension and no mass. There is no tenderness.  Genitourinary:  deferred  Musculoskeletal: He exhibits no edema.  Lymphadenopathy:    He has no cervical adenopathy.  Neurological: He is alert and oriented to person, place, and time.  Skin: Skin is warm and dry. No rash noted.  Psychiatric: He has a normal mood and affect. His behavior is normal.          Assessment & Plan:   Physical exam: Screening blood work ordered No ekg needed today Colonoscopy up to date Immunizations up to date Work on weight loss Continue regular exercise/activity - could increase ideally Monitor skin for changes in freckles/moles No concern with substance abuse  Hypertension Borderline high today without taking his medication I think he needs medication, but Mcclees not need two medications D/c atenolol Continue lisinopril 5 mg daily He will start monitoring his BP at home - goal less than 140/90 Work on weight loss Discussed consequences of uncontrolled htn and importance of keeping bp controlled  Follow up annually unless bp is not controlled at home - he will then come in sooner.

## 2015-01-25 NOTE — Progress Notes (Signed)
Pre visit review using our clinic review tool, if applicable. No additional management support is needed unless otherwise documented below in the visit note. 

## 2015-01-25 NOTE — Telephone Encounter (Signed)
Pt called in and is requesting a call back from nurse.  Would not tell me what it is regarding.     Best number is [phone]

## 2015-01-26 LAB — PSA, TOTAL AND FREE
PSA, Free Pct: 24 % (ref >25)
PSA, Free: 0.26 ng/mL
PSA: 1.09 ng/mL (ref <=4.00)

## 2015-01-26 NOTE — Telephone Encounter (Signed)
Pts insurance physical form has been mailed back to pt as it was not signed as visit and requires signature before being sent to insurance company.

## 2015-03-10 IMAGING — CR DG LUMBAR SPINE 2-3V
2 series · 2 of 2 positions shown · non-contrast
Comparison: MR lumbar spine of 09/14/2013

CLINICAL DATA: L5-S1 micro diskectomy

EXAM:
LUMBAR SPINE - 2-3 VIEW

[lateral (1 of 2)]
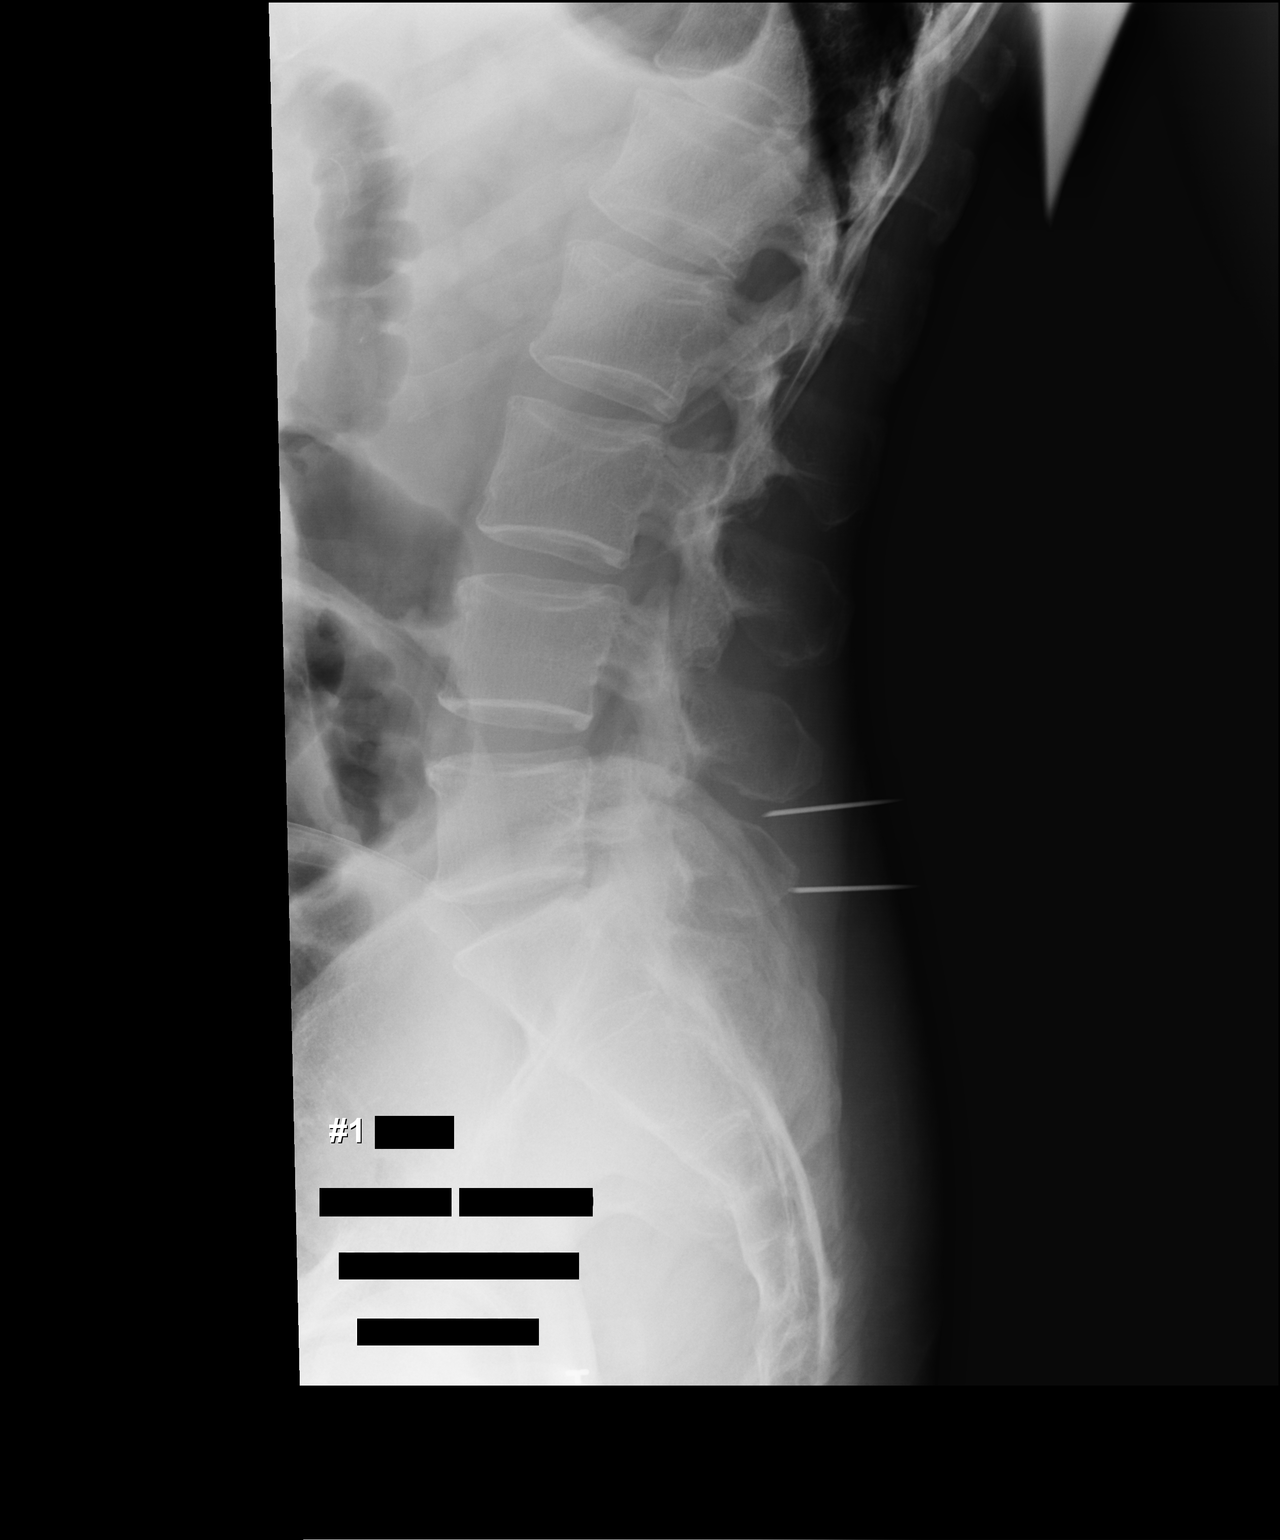

[lateral (2 of 2)]
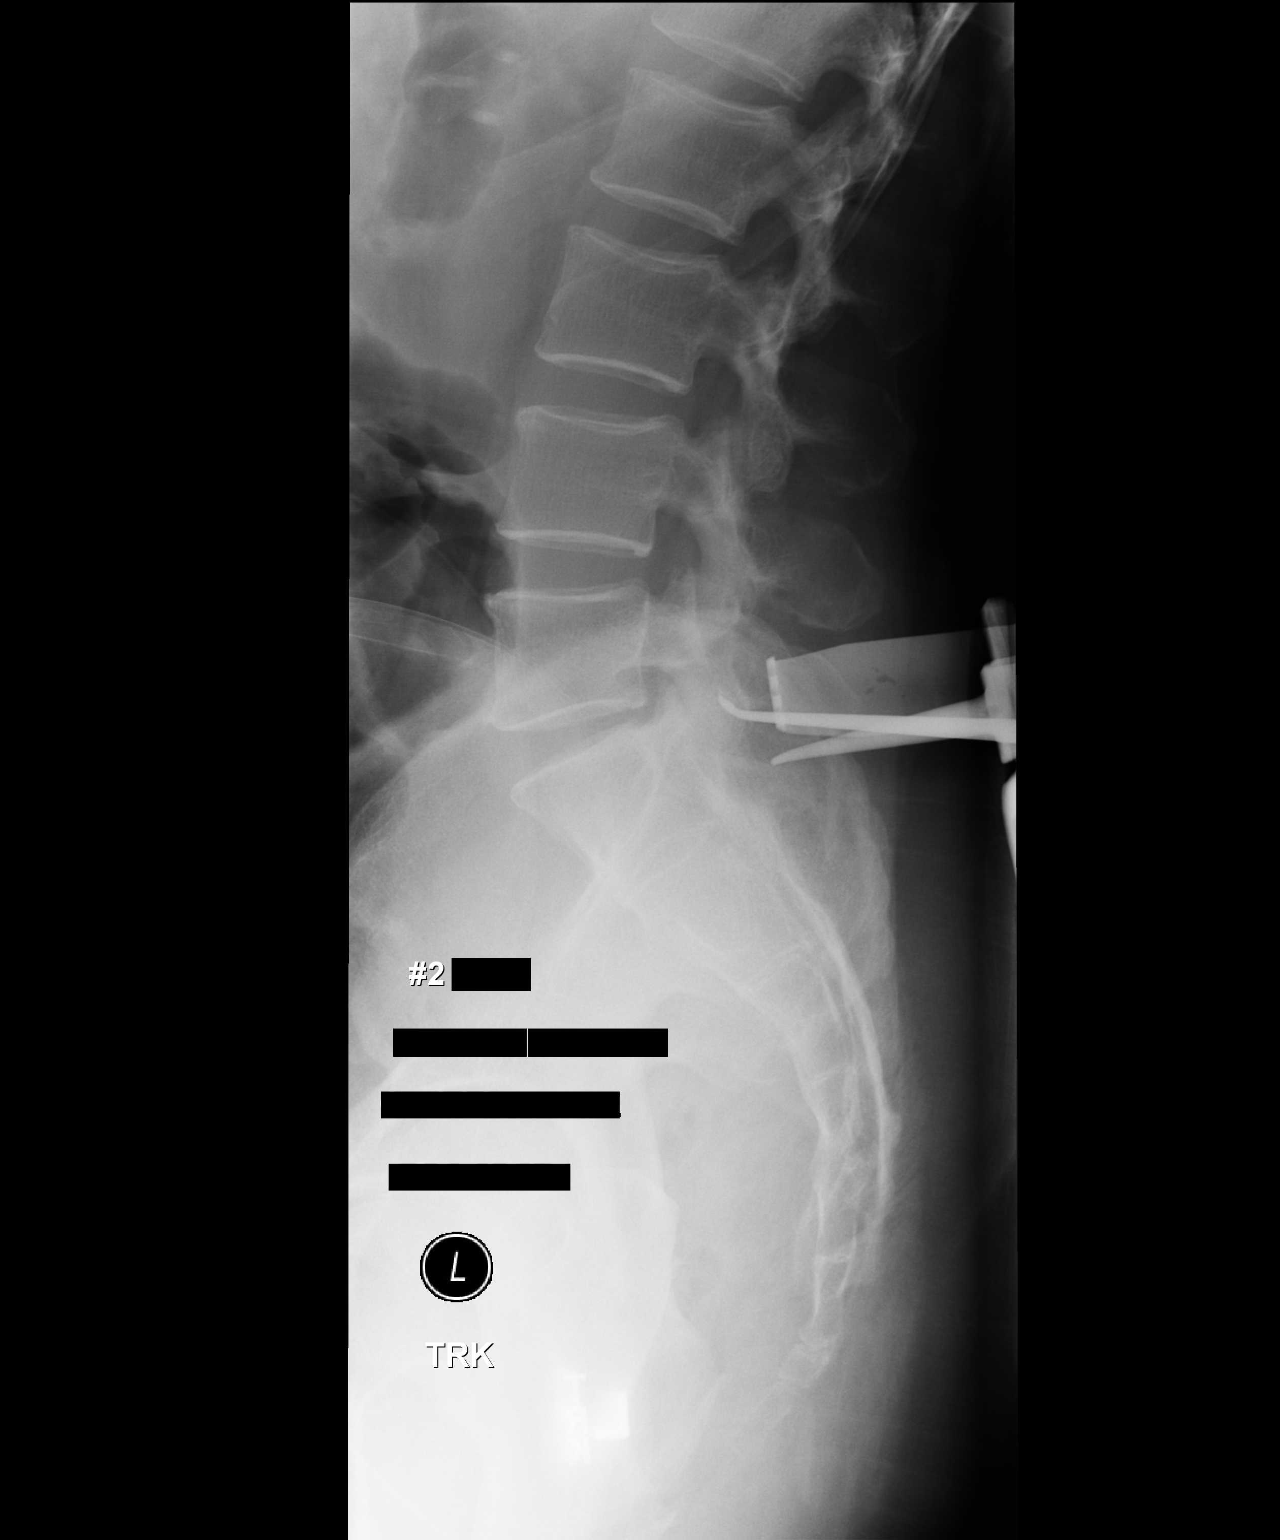

[2 of 2 positions shown; findings below may reference images not displayed]

FINDINGS: On film #1 there are 2 needles positioned posteriorly for
localization. The more caudal needle is directed toward the spinous
process of L5, with the more cephalad needle between the spinous
processes posteriorly of L4 and L5.

Image number 2 shows enhancement posteriorly directed toward the
L5-S1 interspace.
IMPRESSION: Localization of the L5-S1 interspace on the last film.

## 2015-04-18 ENCOUNTER — Telehealth: Payer: Self-pay | Admitting: Family Medicine

## 2015-04-22 ENCOUNTER — Telehealth: Payer: Self-pay

## 2015-04-22 NOTE — Telephone Encounter (Signed)
On 04/22/2015 I received a death certificate from [employer] [facility] [employer] (original). The death certificate is for cremation. The patient is a patient of Cheryll CockayneStacy Burns. [facility] signature. On 04/29/2015 I received the death certificate back from Doctor Burns. I got the death certificate ready and called the funeral home to let them know the death certificate is ready for pickup. I also faxed them a copy per their request.

## 2015-05-11 ENCOUNTER — Telehealth: Payer: Self-pay | Admitting: Certified Registered Nurse Anesthetist

## 2015-05-11 NOTE — Telephone Encounter (Signed)
Returned call to wife regarding patient's recent passing.  Listened to her explain the passing of her husband and her concerns regarding him not taking his medication/caring for himself appropriately.  Offered my condolences and explained heart disease/failure and end of life care in more detail.   Lyla Son (wife) requested to have a form completed for life insurance purposes.  The form was being faxed to [phone] with attention to [facility].

## 2015-05-14 NOTE — Telephone Encounter (Signed)
Sending to dr burns for review

## 2015-05-19 NOTE — Telephone Encounter (Signed)
[facility] [phone]) at 10am this morning regarding pt passing away in his sleep overnight from cardiac arrest.  Advised we would fill out death certificate. To PCP as fyi.

## 2015-05-19 NOTE — Telephone Encounter (Signed)
Faxed in to patients wife. Sending to scan

## 2015-05-19 DEATH — deceased

## 2016-01-27 ENCOUNTER — Encounter: Payer: BLUE CROSS/BLUE SHIELD | Admitting: Internal Medicine
